# Patient Record
Sex: Female | Born: 1970 | Race: White | Hispanic: No | Marital: Single
Health system: Midwestern US, Academic
[De-identification: ages and names within clinical notes are randomized; demographics above are authoritative.]

---

## 2017-02-18 ENCOUNTER — Encounter: Admit: 2017-02-18 | Discharge: 2017-02-18 | Payer: BC Managed Care – PPO

## 2017-02-18 DIAGNOSIS — Z1231 Encounter for screening mammogram for malignant neoplasm of breast: Principal | ICD-10-CM

## 2017-04-26 ENCOUNTER — Encounter: Admit: 2017-04-26 | Discharge: 2017-04-26 | Payer: BC Managed Care – PPO

## 2017-04-27 ENCOUNTER — Encounter: Admit: 2017-04-27 | Discharge: 2017-04-27 | Payer: BC Managed Care – PPO

## 2017-04-27 DIAGNOSIS — M25562 Pain in left knee: Principal | ICD-10-CM

## 2017-04-28 ENCOUNTER — Encounter: Admit: 2017-04-28 | Discharge: 2017-04-28 | Payer: BC Managed Care – PPO

## 2017-04-28 ENCOUNTER — Ambulatory Visit: Admit: 2017-04-28 | Discharge: 2017-04-28 | Payer: BC Managed Care – PPO

## 2017-04-28 DIAGNOSIS — G8929 Other chronic pain: ICD-10-CM

## 2017-04-28 DIAGNOSIS — M2410 Other articular cartilage disorders, unspecified site: ICD-10-CM

## 2017-04-28 DIAGNOSIS — C801 Malignant (primary) neoplasm, unspecified: ICD-10-CM

## 2017-04-28 DIAGNOSIS — M25562 Pain in left knee: Principal | ICD-10-CM

## 2017-04-28 DIAGNOSIS — E039 Hypothyroidism, unspecified: ICD-10-CM

## 2017-04-28 DIAGNOSIS — I509 Heart failure, unspecified: ICD-10-CM

## 2017-04-28 DIAGNOSIS — D242 Benign neoplasm of left breast: ICD-10-CM

## 2017-04-28 DIAGNOSIS — G43909 Migraine, unspecified, not intractable, without status migrainosus: Principal | ICD-10-CM

## 2017-04-28 MED ORDER — DICLOFENAC SODIUM 50 MG PO TBEC
50 mg | ORAL_TABLET | Freq: Two times a day (BID) | ORAL | 0 refills | 60.00000 days | Status: AC
Start: 2017-04-28 — End: 2017-05-24

## 2017-05-04 ENCOUNTER — Ambulatory Visit: Admit: 2017-05-04 | Discharge: 2017-05-04 | Payer: BC Managed Care – PPO

## 2017-05-04 DIAGNOSIS — M2410 Other articular cartilage disorders, unspecified site: Principal | ICD-10-CM

## 2017-05-05 ENCOUNTER — Ambulatory Visit: Admit: 2017-05-05 | Discharge: 2017-05-06 | Payer: BC Managed Care – PPO

## 2017-05-05 ENCOUNTER — Encounter: Admit: 2017-05-05 | Discharge: 2017-05-05 | Payer: BC Managed Care – PPO

## 2017-05-05 DIAGNOSIS — D242 Benign neoplasm of left breast: ICD-10-CM

## 2017-05-05 DIAGNOSIS — C801 Malignant (primary) neoplasm, unspecified: ICD-10-CM

## 2017-05-05 DIAGNOSIS — I509 Heart failure, unspecified: ICD-10-CM

## 2017-05-05 DIAGNOSIS — E039 Hypothyroidism, unspecified: ICD-10-CM

## 2017-05-05 DIAGNOSIS — G43909 Migraine, unspecified, not intractable, without status migrainosus: Principal | ICD-10-CM

## 2017-05-05 DIAGNOSIS — M25562 Pain in left knee: Principal | ICD-10-CM

## 2017-05-06 ENCOUNTER — Encounter: Admit: 2017-05-06 | Discharge: 2017-05-06 | Payer: BC Managed Care – PPO

## 2017-05-06 DIAGNOSIS — M2410 Other articular cartilage disorders, unspecified site: Secondary | ICD-10-CM

## 2017-05-06 DIAGNOSIS — M238X2 Other internal derangements of left knee: ICD-10-CM

## 2017-05-06 DIAGNOSIS — G8929 Other chronic pain: Secondary | ICD-10-CM

## 2017-05-06 DIAGNOSIS — M949 Disorder of cartilage, unspecified: ICD-10-CM

## 2017-05-06 MED ORDER — CEFAZOLIN INJ 1GM IVP
2 g | Freq: Once | INTRAVENOUS | 0 refills | Status: CN
Start: 2017-05-06 — End: ?

## 2017-05-12 ENCOUNTER — Encounter: Admit: 2017-05-12 | Discharge: 2017-05-12 | Payer: BC Managed Care – PPO

## 2017-05-20 ENCOUNTER — Encounter: Admit: 2017-05-20 | Discharge: 2017-05-20 | Payer: BC Managed Care – PPO

## 2017-05-20 DIAGNOSIS — I509 Heart failure, unspecified: ICD-10-CM

## 2017-05-20 DIAGNOSIS — D242 Benign neoplasm of left breast: ICD-10-CM

## 2017-05-20 DIAGNOSIS — E039 Hypothyroidism, unspecified: ICD-10-CM

## 2017-05-20 DIAGNOSIS — G43909 Migraine, unspecified, not intractable, without status migrainosus: Principal | ICD-10-CM

## 2017-05-20 DIAGNOSIS — C801 Malignant (primary) neoplasm, unspecified: ICD-10-CM

## 2017-05-24 ENCOUNTER — Encounter: Admit: 2017-05-24 | Discharge: 2017-05-24 | Payer: BC Managed Care – PPO

## 2017-05-24 DIAGNOSIS — C801 Malignant (primary) neoplasm, unspecified: ICD-10-CM

## 2017-05-24 DIAGNOSIS — I509 Heart failure, unspecified: ICD-10-CM

## 2017-05-24 DIAGNOSIS — D242 Benign neoplasm of left breast: ICD-10-CM

## 2017-05-24 DIAGNOSIS — E039 Hypothyroidism, unspecified: ICD-10-CM

## 2017-05-24 DIAGNOSIS — G43909 Migraine, unspecified, not intractable, without status migrainosus: Principal | ICD-10-CM

## 2017-06-01 ENCOUNTER — Ambulatory Visit: Admit: 2017-06-01 | Discharge: 2017-06-01 | Payer: BC Managed Care – PPO

## 2017-06-01 ENCOUNTER — Encounter: Admit: 2017-06-01 | Discharge: 2017-06-01 | Payer: BC Managed Care – PPO

## 2017-06-01 DIAGNOSIS — D242 Benign neoplasm of left breast: ICD-10-CM

## 2017-06-01 DIAGNOSIS — M2242 Chondromalacia patellae, left knee: ICD-10-CM

## 2017-06-01 DIAGNOSIS — M948X6 Other specified disorders of cartilage, lower leg: Principal | ICD-10-CM

## 2017-06-01 DIAGNOSIS — E039 Hypothyroidism, unspecified: ICD-10-CM

## 2017-06-01 DIAGNOSIS — G43909 Migraine, unspecified, not intractable, without status migrainosus: Principal | ICD-10-CM

## 2017-06-01 DIAGNOSIS — C801 Malignant (primary) neoplasm, unspecified: ICD-10-CM

## 2017-06-01 DIAGNOSIS — I509 Heart failure, unspecified: ICD-10-CM

## 2017-06-01 MED ORDER — LIDOCAINE (PF) 200 MG/10 ML (2 %) IJ SYRG
0 refills | Status: DC
Start: 2017-06-01 — End: 2017-06-01
  Administered 2017-06-01: 14:00:00 100 mg via INTRAVENOUS

## 2017-06-01 MED ORDER — ROPIVACAINE (PF) 5 MG/ML (0.5 %) IJ SOLN
0 refills | Status: DC
Start: 2017-06-01 — End: 2017-06-01
  Administered 2017-06-01: 15:00:00 30 mL via INTRAMUSCULAR

## 2017-06-01 MED ORDER — LIDOCAINE (PF) 10 MG/ML (1 %) IJ SOLN
.1-2 mL | INTRAMUSCULAR | 0 refills | Status: DC | PRN
Start: 2017-06-01 — End: 2017-06-01

## 2017-06-01 MED ORDER — PROPOFOL INJ 10 MG/ML IV VIAL
0 refills | Status: DC
Start: 2017-06-01 — End: 2017-06-01
  Administered 2017-06-01: 14:00:00 200 mg via INTRAVENOUS

## 2017-06-01 MED ORDER — FENTANYL CITRATE (PF) 50 MCG/ML IJ SOLN
25 ug | INTRAVENOUS | 0 refills | Status: DC | PRN
Start: 2017-06-01 — End: 2017-06-01
  Administered 2017-06-01 (×2): 25 ug via INTRAVENOUS

## 2017-06-01 MED ORDER — FENTANYL CITRATE (PF) 50 MCG/ML IJ SOLN
0 refills | Status: DC
Start: 2017-06-01 — End: 2017-06-01
  Administered 2017-06-01 (×2): 50 ug via INTRAVENOUS

## 2017-06-01 MED ORDER — HYDROCODONE-ACETAMINOPHEN 5-325 MG PO TAB
1 | ORAL_TABLET | ORAL | 0 refills | 30.00000 days | Status: AC | PRN
Start: 2017-06-01 — End: 2017-09-13
  Filled 2017-06-01 (×2): qty 30, 8d supply, fill #1

## 2017-06-01 MED ORDER — ONDANSETRON HCL (PF) 4 MG/2 ML IJ SOLN
INTRAVENOUS | 0 refills | Status: DC
Start: 2017-06-01 — End: 2017-06-01
  Administered 2017-06-01: 15:00:00 4 mg via INTRAVENOUS

## 2017-06-01 MED ORDER — FENTANYL CITRATE (PF) 50 MCG/ML IJ SOLN
50 ug | INTRAVENOUS | 0 refills | Status: DC | PRN
Start: 2017-06-01 — End: 2017-06-01

## 2017-06-01 MED ORDER — OXYCODONE 5 MG PO TAB
5-10 mg | Freq: Once | ORAL | 0 refills | Status: CP | PRN
Start: 2017-06-01 — End: ?
  Administered 2017-06-01: 15:00:00 5 mg via ORAL

## 2017-06-01 MED ORDER — CEFAZOLIN INJ 1GM IVP
2 g | Freq: Once | INTRAVENOUS | 0 refills | Status: CP
Start: 2017-06-01 — End: ?
  Administered 2017-06-01: 14:00:00 2 g via INTRAVENOUS

## 2017-06-01 MED ORDER — ONDANSETRON HCL (PF) 4 MG/2 ML IJ SOLN
4 mg | Freq: Once | INTRAVENOUS | 0 refills | Status: DC | PRN
Start: 2017-06-01 — End: 2017-06-01

## 2017-06-01 MED ORDER — DIPHENHYDRAMINE HCL 50 MG/ML IJ SOLN
25 mg | Freq: Once | INTRAVENOUS | 0 refills | Status: DC | PRN
Start: 2017-06-01 — End: 2017-06-01

## 2017-06-01 MED ORDER — LACTATED RINGERS IV SOLP
1000 mL | INTRAVENOUS | 0 refills | Status: DC
Start: 2017-06-01 — End: 2017-06-01
  Administered 2017-06-01: 14:00:00 1000 mL via INTRAVENOUS

## 2017-06-01 MED ORDER — FENTANYL CITRATE (PF) 50 MCG/ML IJ SOLN
50 ug | INTRAVENOUS | 0 refills | Status: DC | PRN
Start: 2017-06-01 — End: 2017-06-01
  Administered 2017-06-01: 15:00:00 50 ug via INTRAVENOUS

## 2017-06-01 MED ORDER — HALOPERIDOL LACTATE 5 MG/ML IJ SOLN
1 mg | Freq: Once | INTRAVENOUS | 0 refills | Status: CP | PRN
Start: 2017-06-01 — End: ?
  Administered 2017-06-01: 15:00:00 1 mg via INTRAVENOUS

## 2017-06-01 MED ORDER — MEPERIDINE (PF) 25 MG/ML IJ SYRG
12.5 mg | INTRAVENOUS | 0 refills | Status: DC | PRN
Start: 2017-06-01 — End: 2017-06-01

## 2017-06-01 MED ORDER — MIDAZOLAM 1 MG/ML IJ SOLN
INTRAVENOUS | 0 refills | Status: DC
Start: 2017-06-01 — End: 2017-06-01
  Administered 2017-06-01: 14:00:00 2 mg via INTRAVENOUS

## 2017-06-03 ENCOUNTER — Encounter: Admit: 2017-06-03 | Discharge: 2017-06-03 | Payer: BC Managed Care – PPO

## 2017-06-03 DIAGNOSIS — I509 Heart failure, unspecified: ICD-10-CM

## 2017-06-03 DIAGNOSIS — D242 Benign neoplasm of left breast: ICD-10-CM

## 2017-06-03 DIAGNOSIS — C801 Malignant (primary) neoplasm, unspecified: ICD-10-CM

## 2017-06-03 DIAGNOSIS — E039 Hypothyroidism, unspecified: ICD-10-CM

## 2017-06-03 DIAGNOSIS — G43909 Migraine, unspecified, not intractable, without status migrainosus: Principal | ICD-10-CM

## 2017-06-16 ENCOUNTER — Ambulatory Visit: Admit: 2017-06-16 | Discharge: 2017-06-17 | Payer: BC Managed Care – PPO

## 2017-06-16 ENCOUNTER — Encounter: Admit: 2017-06-16 | Discharge: 2017-06-16 | Payer: BC Managed Care – PPO

## 2017-06-16 DIAGNOSIS — C801 Malignant (primary) neoplasm, unspecified: ICD-10-CM

## 2017-06-16 DIAGNOSIS — I509 Heart failure, unspecified: ICD-10-CM

## 2017-06-16 DIAGNOSIS — G43909 Migraine, unspecified, not intractable, without status migrainosus: Principal | ICD-10-CM

## 2017-06-16 DIAGNOSIS — E039 Hypothyroidism, unspecified: ICD-10-CM

## 2017-06-16 DIAGNOSIS — D242 Benign neoplasm of left breast: ICD-10-CM

## 2017-06-16 MED ORDER — TRAMADOL 50 MG PO TAB
50 mg | ORAL_TABLET | ORAL | 0 refills | Status: AC | PRN
Start: 2017-06-16 — End: 2017-09-13

## 2017-06-17 DIAGNOSIS — Z9889 Other specified postprocedural states: ICD-10-CM

## 2017-06-17 DIAGNOSIS — M949 Disorder of cartilage, unspecified: Principal | ICD-10-CM

## 2017-06-29 ENCOUNTER — Encounter: Admit: 2017-06-29 | Discharge: 2017-06-29 | Payer: BC Managed Care – PPO

## 2017-07-01 ENCOUNTER — Encounter: Admit: 2017-07-01 | Discharge: 2017-07-01 | Payer: BC Managed Care – PPO

## 2017-07-14 ENCOUNTER — Ambulatory Visit: Admit: 2017-07-14 | Discharge: 2017-07-15 | Payer: BC Managed Care – PPO

## 2017-07-14 ENCOUNTER — Encounter: Admit: 2017-07-14 | Discharge: 2017-07-14 | Payer: BC Managed Care – PPO

## 2017-07-14 DIAGNOSIS — D242 Benign neoplasm of left breast: ICD-10-CM

## 2017-07-14 DIAGNOSIS — M949 Disorder of cartilage, unspecified: Principal | ICD-10-CM

## 2017-07-14 DIAGNOSIS — Z9889 Other specified postprocedural states: ICD-10-CM

## 2017-07-14 DIAGNOSIS — C801 Malignant (primary) neoplasm, unspecified: ICD-10-CM

## 2017-07-14 DIAGNOSIS — G43909 Migraine, unspecified, not intractable, without status migrainosus: Principal | ICD-10-CM

## 2017-07-14 DIAGNOSIS — I509 Heart failure, unspecified: ICD-10-CM

## 2017-07-14 DIAGNOSIS — E039 Hypothyroidism, unspecified: ICD-10-CM

## 2017-07-20 ENCOUNTER — Encounter: Admit: 2017-07-20 | Discharge: 2017-07-20 | Payer: BC Managed Care – PPO

## 2017-07-22 ENCOUNTER — Encounter: Admit: 2017-07-22 | Discharge: 2017-07-22 | Payer: BC Managed Care – PPO

## 2017-07-22 DIAGNOSIS — M949 Disorder of cartilage, unspecified: Principal | ICD-10-CM

## 2017-07-22 MED ORDER — CEFAZOLIN INJ 1GM IVP
2 g | Freq: Once | INTRAVENOUS | 0 refills | Status: CN
Start: 2017-07-22 — End: ?

## 2017-07-23 ENCOUNTER — Encounter: Admit: 2017-07-23 | Discharge: 2017-07-23 | Payer: BC Managed Care – PPO

## 2017-07-28 ENCOUNTER — Ambulatory Visit: Admit: 2017-07-28 | Discharge: 2017-07-29 | Payer: BC Managed Care – PPO

## 2017-07-28 ENCOUNTER — Encounter: Admit: 2017-07-28 | Discharge: 2017-07-28 | Payer: BC Managed Care – PPO

## 2017-07-28 DIAGNOSIS — Z9889 Other specified postprocedural states: ICD-10-CM

## 2017-07-28 DIAGNOSIS — D242 Benign neoplasm of left breast: ICD-10-CM

## 2017-07-28 DIAGNOSIS — I509 Heart failure, unspecified: ICD-10-CM

## 2017-07-28 DIAGNOSIS — M949 Disorder of cartilage, unspecified: Principal | ICD-10-CM

## 2017-07-28 DIAGNOSIS — C801 Malignant (primary) neoplasm, unspecified: ICD-10-CM

## 2017-07-28 DIAGNOSIS — G43909 Migraine, unspecified, not intractable, without status migrainosus: Principal | ICD-10-CM

## 2017-07-28 DIAGNOSIS — E039 Hypothyroidism, unspecified: ICD-10-CM

## 2017-08-11 ENCOUNTER — Encounter: Admit: 2017-08-11 | Discharge: 2017-08-11 | Payer: BC Managed Care – PPO

## 2017-09-03 ENCOUNTER — Ambulatory Visit: Admit: 2017-09-03 | Discharge: 2017-09-03 | Payer: BC Managed Care – PPO

## 2017-09-03 DIAGNOSIS — Z1231 Encounter for screening mammogram for malignant neoplasm of breast: Principal | ICD-10-CM

## 2017-09-13 ENCOUNTER — Encounter: Admit: 2017-09-13 | Discharge: 2017-09-13 | Payer: BC Managed Care – PPO

## 2017-09-13 DIAGNOSIS — C801 Malignant (primary) neoplasm, unspecified: ICD-10-CM

## 2017-09-13 DIAGNOSIS — I509 Heart failure, unspecified: ICD-10-CM

## 2017-09-13 DIAGNOSIS — D242 Benign neoplasm of left breast: ICD-10-CM

## 2017-09-13 DIAGNOSIS — E039 Hypothyroidism, unspecified: ICD-10-CM

## 2017-09-13 DIAGNOSIS — G43909 Migraine, unspecified, not intractable, without status migrainosus: Principal | ICD-10-CM

## 2017-09-15 ENCOUNTER — Encounter: Admit: 2017-09-15 | Discharge: 2017-09-15 | Payer: BC Managed Care – PPO

## 2017-09-15 ENCOUNTER — Ambulatory Visit: Admit: 2017-09-15 | Discharge: 2017-09-16 | Payer: BC Managed Care – PPO

## 2017-09-15 DIAGNOSIS — D242 Benign neoplasm of left breast: ICD-10-CM

## 2017-09-15 DIAGNOSIS — M949 Disorder of cartilage, unspecified: Principal | ICD-10-CM

## 2017-09-15 DIAGNOSIS — I509 Heart failure, unspecified: ICD-10-CM

## 2017-09-15 DIAGNOSIS — G43909 Migraine, unspecified, not intractable, without status migrainosus: Principal | ICD-10-CM

## 2017-09-15 DIAGNOSIS — E039 Hypothyroidism, unspecified: ICD-10-CM

## 2017-09-15 DIAGNOSIS — C801 Malignant (primary) neoplasm, unspecified: ICD-10-CM

## 2017-09-16 ENCOUNTER — Encounter: Admit: 2017-09-16 | Discharge: 2017-09-16 | Payer: BC Managed Care – PPO

## 2017-09-16 DIAGNOSIS — Z8679 Personal history of other diseases of the circulatory system: ICD-10-CM

## 2017-09-16 DIAGNOSIS — Z01818 Encounter for other preprocedural examination: ICD-10-CM

## 2017-09-16 DIAGNOSIS — R079 Chest pain, unspecified: ICD-10-CM

## 2017-09-16 DIAGNOSIS — M949 Disorder of cartilage, unspecified: Principal | ICD-10-CM

## 2017-09-23 ENCOUNTER — Ambulatory Visit: Admit: 2017-09-23 | Discharge: 2017-09-24 | Payer: BC Managed Care – PPO

## 2017-09-23 ENCOUNTER — Encounter: Admit: 2017-09-23 | Discharge: 2017-09-23 | Payer: BC Managed Care – PPO

## 2017-09-23 DIAGNOSIS — C801 Malignant (primary) neoplasm, unspecified: ICD-10-CM

## 2017-09-23 DIAGNOSIS — D242 Benign neoplasm of left breast: ICD-10-CM

## 2017-09-23 DIAGNOSIS — I509 Heart failure, unspecified: ICD-10-CM

## 2017-09-23 DIAGNOSIS — R0789 Other chest pain: ICD-10-CM

## 2017-09-23 DIAGNOSIS — O903 Peripartum cardiomyopathy: ICD-10-CM

## 2017-09-23 DIAGNOSIS — R0600 Dyspnea, unspecified: ICD-10-CM

## 2017-09-23 DIAGNOSIS — G43909 Migraine, unspecified, not intractable, without status migrainosus: Principal | ICD-10-CM

## 2017-09-23 DIAGNOSIS — E039 Hypothyroidism, unspecified: ICD-10-CM

## 2017-09-24 ENCOUNTER — Encounter: Admit: 2017-09-24 | Discharge: 2017-09-24 | Payer: BC Managed Care – PPO

## 2017-09-24 DIAGNOSIS — R079 Chest pain, unspecified: ICD-10-CM

## 2017-09-24 DIAGNOSIS — Z8679 Personal history of other diseases of the circulatory system: ICD-10-CM

## 2017-09-24 DIAGNOSIS — Z01818 Encounter for other preprocedural examination: ICD-10-CM

## 2017-09-24 DIAGNOSIS — M949 Disorder of cartilage, unspecified: Principal | ICD-10-CM

## 2017-09-27 ENCOUNTER — Encounter: Admit: 2017-09-27 | Discharge: 2017-09-27 | Payer: BC Managed Care – PPO

## 2017-09-28 ENCOUNTER — Encounter: Admit: 2017-09-28 | Discharge: 2017-09-28 | Payer: BC Managed Care – PPO

## 2017-09-28 DIAGNOSIS — O903 Peripartum cardiomyopathy: ICD-10-CM

## 2017-09-28 DIAGNOSIS — G43909 Migraine, unspecified, not intractable, without status migrainosus: Principal | ICD-10-CM

## 2017-09-28 DIAGNOSIS — D242 Benign neoplasm of left breast: ICD-10-CM

## 2017-09-28 DIAGNOSIS — I509 Heart failure, unspecified: ICD-10-CM

## 2017-09-28 DIAGNOSIS — C801 Malignant (primary) neoplasm, unspecified: ICD-10-CM

## 2017-09-28 DIAGNOSIS — E039 Hypothyroidism, unspecified: ICD-10-CM

## 2017-09-28 MED ORDER — FENTANYL CITRATE (PF) 50 MCG/ML IJ SOLN
25 ug | INTRAVENOUS | 0 refills | Status: DC | PRN
Start: 2017-09-28 — End: 2017-09-29
  Administered 2017-09-29 (×2): 25 ug via INTRAVENOUS

## 2017-09-28 MED ORDER — DIPHENHYDRAMINE HCL 25 MG PO CAP
25 mg | ORAL | 0 refills | Status: DC | PRN
Start: 2017-09-28 — End: 2017-09-29

## 2017-09-28 MED ORDER — LIDOCAINE (PF) 10 MG/ML (1 %) IJ SOLN
.1-2 mL | INTRAMUSCULAR | 0 refills | Status: DC | PRN
Start: 2017-09-28 — End: 2017-09-28
  Administered 2017-09-28: 14:00:00 0.1 mL via INTRAMUSCULAR

## 2017-09-28 MED ORDER — DEXAMETHASONE SODIUM PHOSPHATE 4 MG/ML IJ SOLN
INTRAVENOUS | 0 refills | Status: DC
Start: 2017-09-28 — End: 2017-09-28
  Administered 2017-09-28: 14:00:00 8 mg via INTRAVENOUS

## 2017-09-28 MED ORDER — ACETAMINOPHEN 325 MG PO TAB
650 mg | ORAL | 0 refills | Status: DC
Start: 2017-09-28 — End: 2017-09-29
  Administered 2017-09-29: 08:00:00 650 mg via ORAL

## 2017-09-28 MED ORDER — OXYCODONE 5 MG PO TAB
5-10 mg | ORAL | 0 refills | Status: DC | PRN
Start: 2017-09-28 — End: 2017-09-29
  Administered 2017-09-28 – 2017-09-29 (×3): 10 mg via ORAL

## 2017-09-28 MED ORDER — MIDAZOLAM 1 MG/ML IJ SOLN
INTRAVENOUS | 0 refills | Status: DC
Start: 2017-09-28 — End: 2017-09-28
  Administered 2017-09-28: 14:00:00 2 mg via INTRAVENOUS

## 2017-09-28 MED ORDER — EPHEDRINE SULFATE 50 MG/ML IJ SOLN
0 refills | Status: DC
Start: 2017-09-28 — End: 2017-09-28
  Administered 2017-09-28 (×5): 10 mg via INTRAVENOUS

## 2017-09-28 MED ORDER — FENTANYL CITRATE (PF) 50 MCG/ML IJ SOLN
25 ug | INTRAVENOUS | 0 refills | Status: DC | PRN
Start: 2017-09-28 — End: 2017-09-28

## 2017-09-28 MED ORDER — LEVOTHYROXINE 137 MCG PO TAB
137 ug | Freq: Every day | ORAL | 0 refills | Status: CN
Start: 2017-09-28 — End: ?

## 2017-09-28 MED ORDER — DIPHENHYDRAMINE HCL 50 MG/ML IJ SOLN
25 mg | INTRAVENOUS | 0 refills | Status: DC | PRN
Start: 2017-09-28 — End: 2017-09-29

## 2017-09-28 MED ORDER — CEFAZOLIN INJ 1GM IVP
2 g | Freq: Once | INTRAVENOUS | 0 refills | Status: CP
Start: 2017-09-28 — End: ?
  Administered 2017-09-28: 14:00:00 2 g via INTRAVENOUS

## 2017-09-28 MED ORDER — LACTATED RINGERS IV SOLP
INTRAVENOUS | 0 refills | Status: DC
Start: 2017-09-28 — End: 2017-09-28
  Administered 2017-09-28: 17:00:00 1000.000 mL via INTRAVENOUS

## 2017-09-28 MED ORDER — LACTATED RINGERS IV SOLP
INTRAVENOUS | 0 refills | Status: DC
Start: 2017-09-28 — End: 2017-09-28
  Administered 2017-09-28: 14:00:00 1000.000 mL via INTRAVENOUS

## 2017-09-28 MED ORDER — ONDANSETRON HCL (PF) 4 MG/2 ML IJ SOLN
INTRAVENOUS | 0 refills | Status: DC
Start: 2017-09-28 — End: 2017-09-28
  Administered 2017-09-28: 16:00:00 4 mg via INTRAVENOUS

## 2017-09-28 MED ORDER — TRANEXAMIC ACID IN NS NON-STANDARD IVPB ICC
1 g | INTRAVENOUS | 0 refills | Status: AC
Start: 2017-09-28 — End: ?

## 2017-09-28 MED ORDER — ACETAMINOPHEN 650 MG RE SUPP
650 mg | RECTAL | 0 refills | Status: DC | PRN
Start: 2017-09-28 — End: 2017-09-29

## 2017-09-28 MED ORDER — ONDANSETRON HCL 4 MG PO TAB
4 mg | ORAL_TABLET | ORAL | 0 refills | 8.00000 days | Status: AC | PRN
Start: 2017-09-28 — End: 2018-05-23
  Filled 2017-09-29: qty 20, 7d supply

## 2017-09-28 MED ORDER — LEVOTHYROXINE 137 MCG PO TAB
137 ug | Freq: Every day | ORAL | 0 refills | Status: DC
Start: 2017-09-28 — End: 2017-09-29
  Administered 2017-09-29: 12:00:00 137 ug via ORAL

## 2017-09-28 MED ORDER — BISACODYL 10 MG RE SUPP
10 mg | Freq: Every day | RECTAL | 0 refills | Status: DC | PRN
Start: 2017-09-28 — End: 2017-09-29

## 2017-09-28 MED ORDER — FENTANYL CITRATE (PF) 50 MCG/ML IJ SOLN
50 ug | INTRAVENOUS | 0 refills | Status: DC | PRN
Start: 2017-09-28 — End: 2017-09-28
  Administered 2017-09-28 (×3): 50 ug via INTRAVENOUS

## 2017-09-28 MED ORDER — SODIUM CHLORIDE 0.45% WITH POTASSIUM CHLORIDE 20 MEQ/L IV SOLP
INTRAVENOUS | 0 refills | Status: DC
Start: 2017-09-28 — End: 2017-09-29
  Administered 2017-09-28: 21:00:00 1000.000 mL via INTRAVENOUS

## 2017-09-28 MED ORDER — OXYCODONE 5 MG PO TAB
5-10 mg | Freq: Once | ORAL | 0 refills | Status: DC | PRN
Start: 2017-09-28 — End: 2017-09-28

## 2017-09-28 MED ORDER — OXYCODONE-ACETAMINOPHEN 5-325 MG PO TAB
1-2 | ORAL_TABLET | ORAL | 0 refills | 2.00000 days | Status: AC | PRN
Start: 2017-09-28 — End: 2017-10-04

## 2017-09-28 MED ORDER — ACETAMINOPHEN 1,000 MG/100 ML (10 MG/ML) IV SOLN
1000 mg | Freq: Once | INTRAVENOUS | 0 refills | Status: CP
Start: 2017-09-28 — End: ?

## 2017-09-28 MED ORDER — TRANEXAMIC ACID 1 G IVPB
0 refills | Status: DC
Start: 2017-09-28 — End: 2017-09-28
  Administered 2017-09-28 (×2): 1 g via INTRAVENOUS

## 2017-09-28 MED ORDER — MAGNESIUM HYDROXIDE 2,400 MG/10 ML PO SUSP
10 mL | Freq: Every day | ORAL | 0 refills | Status: DC
Start: 2017-09-28 — End: 2017-09-29
  Administered 2017-09-29: 02:00:00 10 mL via ORAL

## 2017-09-28 MED ORDER — FENTANYL CITRATE (PF) 50 MCG/ML IJ SOLN
0 refills | Status: DC
Start: 2017-09-28 — End: 2017-09-28
  Administered 2017-09-28 (×5): 50 ug via INTRAVENOUS

## 2017-09-28 MED ORDER — CEFAZOLIN INJ 1GM IVP
2 g | INTRAVENOUS | 0 refills | Status: CP
Start: 2017-09-28 — End: ?
  Administered 2017-09-28 – 2017-09-29 (×2): 2 g via INTRAVENOUS

## 2017-09-28 MED ORDER — LIDOCAINE (PF) 200 MG/10 ML (2 %) IJ SYRG
0 refills | Status: DC
Start: 2017-09-28 — End: 2017-09-28
  Administered 2017-09-28: 14:00:00 100 mg via INTRAVENOUS

## 2017-09-28 MED ORDER — DOCUSATE SODIUM 100 MG PO CAP
100 mg | Freq: Two times a day (BID) | ORAL | 0 refills | Status: DC
Start: 2017-09-28 — End: 2017-09-29
  Administered 2017-09-29: 02:00:00 100 mg via ORAL

## 2017-09-28 MED ORDER — OXYCODONE-ACETAMINOPHEN 5-325 MG PO TAB
1-2 | ORAL | 0 refills | Status: DC | PRN
Start: 2017-09-28 — End: 2017-09-29
  Administered 2017-09-28: 20:00:00 2 via ORAL

## 2017-09-28 MED ORDER — DIAZEPAM 2 MG PO TAB
2 mg | ORAL_TABLET | ORAL | 0 refills | 7.00000 days | Status: AC | PRN
Start: 2017-09-28 — End: 2018-05-23
  Filled 2017-09-29 (×2): qty 10, 3d supply, fill #1

## 2017-09-28 MED ORDER — ACETAMINOPHEN 325 MG PO TAB
650 mg | ORAL | 0 refills | Status: DC | PRN
Start: 2017-09-28 — End: 2017-09-29

## 2017-09-28 MED ORDER — ONDANSETRON HCL (PF) 4 MG/2 ML IJ SOLN
4 mg | INTRAVENOUS | 0 refills | Status: DC | PRN
Start: 2017-09-28 — End: 2017-09-29
  Administered 2017-09-29: 17:00:00 4 mg via INTRAVENOUS

## 2017-09-28 MED ORDER — THROMBIN (BOVINE) 5,000 UNIT TP SOLR
0 refills | Status: DC
Start: 2017-09-28 — End: 2017-09-28
  Administered 2017-09-28: 15:00:00 10000 [IU] via TOPICAL

## 2017-09-28 MED ORDER — FENTANYL CITRATE (PF) 50 MCG/ML IJ SOLN
50 ug | INTRAVENOUS | 0 refills | Status: DC | PRN
Start: 2017-09-28 — End: 2017-09-28

## 2017-09-28 MED ORDER — DOXYCYCLINE HYCLATE 100 MG PO TAB
100 mg | Freq: Two times a day (BID) | ORAL | 0 refills | Status: DC
Start: 2017-09-28 — End: 2017-09-29
  Administered 2017-09-29: 02:00:00 100 mg via ORAL

## 2017-09-28 MED ORDER — PROPOFOL INJ 10 MG/ML IV VIAL
0 refills | Status: DC
Start: 2017-09-28 — End: 2017-09-28
  Administered 2017-09-28: 14:00:00 200 mg via INTRAVENOUS

## 2017-09-28 MED ADMIN — ACETAMINOPHEN 1,000 MG/100 ML (10 MG/ML) IV SOLN [305632]: 1000 mg | INTRAVENOUS | @ 17:00:00 | Stop: 2017-09-28 | NDC 43825010201

## 2017-09-28 MED ADMIN — SODIUM CHLORIDE 0.9 % IJ SOLN [7319]: 20 mL | INTRAVENOUS | @ 21:00:00 | Stop: 2017-09-28 | NDC 00409488803

## 2017-09-28 MED ADMIN — FENTANYL CITRATE (PF) 50 MCG/ML IJ SOLN [3037]: 100 ug | INTRAVENOUS | @ 16:00:00 | Stop: 2017-09-28 | NDC 00641602701

## 2017-09-29 ENCOUNTER — Encounter: Admit: 2017-09-28 | Discharge: 2017-09-29 | Payer: BC Managed Care – PPO

## 2017-09-29 ENCOUNTER — Encounter: Admit: 2017-09-29 | Discharge: 2017-09-29 | Payer: BC Managed Care – PPO

## 2017-09-29 DIAGNOSIS — M949 Disorder of cartilage, unspecified: Principal | ICD-10-CM

## 2017-09-29 DIAGNOSIS — F419 Anxiety disorder, unspecified: ICD-10-CM

## 2017-09-29 MED ORDER — OXYCODONE-ACETAMINOPHEN 10-325 MG PO TAB
1-2 | ORAL | 0 refills | Status: DC | PRN
Start: 2017-09-29 — End: 2017-09-29
  Administered 2017-09-29: 19:00:00 2 via ORAL

## 2017-09-29 MED ORDER — OXYCODONE-ACETAMINOPHEN 7.5-325 MG PO TAB
1-2 | ORAL_TABLET | ORAL | 0 refills | 2.00000 days | Status: AC | PRN
Start: 2017-09-29 — End: 2017-10-04
  Filled 2017-09-29 (×2): qty 30, 3d supply, fill #1

## 2017-09-29 MED ADMIN — SODIUM CHLORIDE 0.9 % IJ SOLN [7319]: 20 mL | INTRAVENOUS | @ 06:00:00 | Stop: 2017-09-29 | NDC 00409488803

## 2017-09-29 MED ADMIN — OXYCODONE-ACETAMINOPHEN 10-325 MG PO TAB [31864]: 2 | ORAL | @ 12:00:00 | Stop: 2017-09-29 | NDC 00904643961

## 2017-09-30 ENCOUNTER — Encounter: Admit: 2017-09-30 | Discharge: 2017-09-30 | Payer: BC Managed Care – PPO

## 2017-09-30 DIAGNOSIS — O903 Peripartum cardiomyopathy: ICD-10-CM

## 2017-09-30 DIAGNOSIS — G43909 Migraine, unspecified, not intractable, without status migrainosus: Principal | ICD-10-CM

## 2017-09-30 DIAGNOSIS — C801 Malignant (primary) neoplasm, unspecified: ICD-10-CM

## 2017-09-30 DIAGNOSIS — D242 Benign neoplasm of left breast: ICD-10-CM

## 2017-09-30 DIAGNOSIS — E039 Hypothyroidism, unspecified: ICD-10-CM

## 2017-09-30 DIAGNOSIS — I509 Heart failure, unspecified: ICD-10-CM

## 2017-10-01 ENCOUNTER — Encounter: Admit: 2017-10-01 | Discharge: 2017-10-01 | Payer: BC Managed Care – PPO

## 2017-10-04 ENCOUNTER — Encounter: Admit: 2017-10-04 | Discharge: 2017-10-04 | Payer: BC Managed Care – PPO

## 2017-10-04 MED ORDER — OXYCODONE-ACETAMINOPHEN 5-325 MG PO TAB
1-2 | ORAL_TABLET | ORAL | 0 refills | 2.00000 days | Status: AC | PRN
Start: 2017-10-04 — End: 2018-05-23

## 2017-10-06 ENCOUNTER — Encounter: Admit: 2017-10-06 | Discharge: 2017-10-06 | Payer: BC Managed Care – PPO

## 2017-10-06 MED ORDER — HYDROCODONE-ACETAMINOPHEN 5-325 MG PO TAB
1-2 | ORAL_TABLET | ORAL | 0 refills | 30.00000 days | Status: AC | PRN
Start: 2017-10-06 — End: 2018-05-23

## 2017-10-13 ENCOUNTER — Encounter: Admit: 2017-10-13 | Discharge: 2017-10-13 | Payer: BC Managed Care – PPO

## 2017-10-13 ENCOUNTER — Ambulatory Visit: Admit: 2017-10-13 | Discharge: 2017-10-14 | Payer: BC Managed Care – PPO

## 2017-10-13 DIAGNOSIS — C801 Malignant (primary) neoplasm, unspecified: ICD-10-CM

## 2017-10-13 DIAGNOSIS — E039 Hypothyroidism, unspecified: ICD-10-CM

## 2017-10-13 DIAGNOSIS — I509 Heart failure, unspecified: ICD-10-CM

## 2017-10-13 DIAGNOSIS — G43909 Migraine, unspecified, not intractable, without status migrainosus: Principal | ICD-10-CM

## 2017-10-13 DIAGNOSIS — D242 Benign neoplasm of left breast: ICD-10-CM

## 2017-10-13 DIAGNOSIS — O903 Peripartum cardiomyopathy: ICD-10-CM

## 2017-10-14 DIAGNOSIS — Z9889 Other specified postprocedural states: ICD-10-CM

## 2017-10-14 DIAGNOSIS — M949 Disorder of cartilage, unspecified: Principal | ICD-10-CM

## 2017-10-19 ENCOUNTER — Encounter: Admit: 2017-10-19 | Discharge: 2017-10-19 | Payer: BC Managed Care – PPO

## 2017-10-20 ENCOUNTER — Encounter: Admit: 2017-10-20 | Discharge: 2017-10-20 | Payer: BC Managed Care – PPO

## 2017-10-26 ENCOUNTER — Encounter: Admit: 2017-10-26 | Discharge: 2017-10-26 | Payer: BC Managed Care – PPO

## 2017-11-10 ENCOUNTER — Ambulatory Visit: Admit: 2017-11-10 | Discharge: 2017-11-11 | Payer: BC Managed Care – PPO

## 2017-11-10 ENCOUNTER — Encounter: Admit: 2017-11-10 | Discharge: 2017-11-10 | Payer: BC Managed Care – PPO

## 2017-11-10 DIAGNOSIS — M949 Disorder of cartilage, unspecified: Principal | ICD-10-CM

## 2017-11-10 DIAGNOSIS — D242 Benign neoplasm of left breast: ICD-10-CM

## 2017-11-10 DIAGNOSIS — C801 Malignant (primary) neoplasm, unspecified: ICD-10-CM

## 2017-11-10 DIAGNOSIS — O903 Peripartum cardiomyopathy: ICD-10-CM

## 2017-11-10 DIAGNOSIS — G43909 Migraine, unspecified, not intractable, without status migrainosus: Principal | ICD-10-CM

## 2017-11-10 DIAGNOSIS — E039 Hypothyroidism, unspecified: ICD-10-CM

## 2017-11-10 DIAGNOSIS — I509 Heart failure, unspecified: ICD-10-CM

## 2017-11-11 ENCOUNTER — Encounter: Admit: 2017-11-11 | Discharge: 2017-11-11 | Payer: BC Managed Care – PPO

## 2017-11-17 ENCOUNTER — Encounter: Admit: 2017-11-17 | Discharge: 2017-11-17 | Payer: BC Managed Care – PPO

## 2017-11-22 ENCOUNTER — Encounter: Admit: 2017-11-22 | Discharge: 2017-11-22 | Payer: BC Managed Care – PPO

## 2017-11-30 ENCOUNTER — Encounter: Admit: 2017-11-30 | Discharge: 2017-11-30 | Payer: BC Managed Care – PPO

## 2017-11-30 ENCOUNTER — Ambulatory Visit: Admit: 2017-11-30 | Discharge: 2017-11-30 | Payer: BC Managed Care – PPO

## 2017-11-30 DIAGNOSIS — O903 Peripartum cardiomyopathy: ICD-10-CM

## 2017-11-30 DIAGNOSIS — Z01818 Encounter for other preprocedural examination: Principal | ICD-10-CM

## 2017-11-30 DIAGNOSIS — C801 Malignant (primary) neoplasm, unspecified: ICD-10-CM

## 2017-11-30 DIAGNOSIS — R0609 Other forms of dyspnea: ICD-10-CM

## 2017-11-30 DIAGNOSIS — I509 Heart failure, unspecified: ICD-10-CM

## 2017-11-30 DIAGNOSIS — E039 Hypothyroidism, unspecified: ICD-10-CM

## 2017-11-30 DIAGNOSIS — G43909 Migraine, unspecified, not intractable, without status migrainosus: Principal | ICD-10-CM

## 2017-11-30 DIAGNOSIS — D242 Benign neoplasm of left breast: ICD-10-CM

## 2017-11-30 MED ORDER — FUROSEMIDE 20 MG PO TAB
20 mg | ORAL_TABLET | Freq: Every day | ORAL | 3 refills | 90.00000 days | Status: AC | PRN
Start: 2017-11-30 — End: 2018-05-23

## 2017-11-30 MED ORDER — PERFLUTREN LIPID MICROSPHERES 1.1 MG/ML IV SUSP
1-20 mL | Freq: Once | INTRAVENOUS | 0 refills | Status: CP | PRN
Start: 2017-11-30 — End: ?

## 2017-11-30 MED ADMIN — PERFLUTREN LIPID MICROSPHERES 1.1 MG/ML IV SUSP [79178]: 3 mL | INTRAVENOUS | @ 19:00:00 | Stop: 2017-11-30 | NDC 11994001104

## 2017-12-03 ENCOUNTER — Encounter: Admit: 2017-12-03 | Discharge: 2017-12-03 | Payer: BC Managed Care – PPO

## 2017-12-22 ENCOUNTER — Ambulatory Visit: Admit: 2017-12-22 | Discharge: 2017-12-23 | Payer: BC Managed Care – PPO

## 2017-12-22 ENCOUNTER — Encounter: Admit: 2017-12-22 | Discharge: 2017-12-22 | Payer: BC Managed Care – PPO

## 2017-12-22 DIAGNOSIS — D242 Benign neoplasm of left breast: ICD-10-CM

## 2017-12-22 DIAGNOSIS — I509 Heart failure, unspecified: ICD-10-CM

## 2017-12-22 DIAGNOSIS — E039 Hypothyroidism, unspecified: ICD-10-CM

## 2017-12-22 DIAGNOSIS — G43909 Migraine, unspecified, not intractable, without status migrainosus: Principal | ICD-10-CM

## 2017-12-22 DIAGNOSIS — C801 Malignant (primary) neoplasm, unspecified: ICD-10-CM

## 2017-12-22 DIAGNOSIS — M949 Disorder of cartilage, unspecified: Principal | ICD-10-CM

## 2017-12-22 DIAGNOSIS — O903 Peripartum cardiomyopathy: ICD-10-CM

## 2017-12-22 DIAGNOSIS — Z4889 Encounter for other specified surgical aftercare: ICD-10-CM

## 2017-12-27 ENCOUNTER — Encounter: Admit: 2017-12-27 | Discharge: 2017-12-27 | Payer: BC Managed Care – PPO

## 2018-01-27 ENCOUNTER — Encounter: Admit: 2018-01-27 | Discharge: 2018-01-27 | Payer: BC Managed Care – PPO

## 2018-01-27 DIAGNOSIS — Z9889 Other specified postprocedural states: Principal | ICD-10-CM

## 2018-02-02 ENCOUNTER — Ambulatory Visit: Admit: 2018-02-02 | Discharge: 2018-02-03 | Payer: BC Managed Care – PPO

## 2018-02-02 ENCOUNTER — Encounter: Admit: 2018-02-02 | Discharge: 2018-02-02 | Payer: BC Managed Care – PPO

## 2018-02-02 DIAGNOSIS — I509 Heart failure, unspecified: ICD-10-CM

## 2018-02-02 DIAGNOSIS — C801 Malignant (primary) neoplasm, unspecified: ICD-10-CM

## 2018-02-02 DIAGNOSIS — O903 Peripartum cardiomyopathy: ICD-10-CM

## 2018-02-02 DIAGNOSIS — G43909 Migraine, unspecified, not intractable, without status migrainosus: Principal | ICD-10-CM

## 2018-02-02 DIAGNOSIS — E039 Hypothyroidism, unspecified: ICD-10-CM

## 2018-02-02 DIAGNOSIS — D242 Benign neoplasm of left breast: ICD-10-CM

## 2018-02-03 DIAGNOSIS — M949 Disorder of cartilage, unspecified: Principal | ICD-10-CM

## 2018-02-03 DIAGNOSIS — Z9889 Other specified postprocedural states: ICD-10-CM

## 2018-02-04 ENCOUNTER — Encounter: Admit: 2018-02-04 | Discharge: 2018-02-04 | Payer: BC Managed Care – PPO

## 2018-03-01 ENCOUNTER — Encounter: Admit: 2018-03-01 | Discharge: 2018-03-01 | Payer: BC Managed Care – PPO

## 2018-03-08 ENCOUNTER — Encounter: Admit: 2018-03-08 | Discharge: 2018-03-08 | Payer: BC Managed Care – PPO

## 2018-03-08 ENCOUNTER — Ambulatory Visit: Admit: 2018-03-08 | Discharge: 2018-03-08 | Payer: BC Managed Care – PPO

## 2018-03-08 ENCOUNTER — Ambulatory Visit: Admit: 2018-03-08 | Discharge: 2018-03-09 | Payer: BC Managed Care – PPO

## 2018-03-08 DIAGNOSIS — O903 Peripartum cardiomyopathy: Secondary | ICD-10-CM

## 2018-03-08 DIAGNOSIS — E039 Hypothyroidism, unspecified: Secondary | ICD-10-CM

## 2018-03-08 DIAGNOSIS — G43909 Migraine, unspecified, not intractable, without status migrainosus: Secondary | ICD-10-CM

## 2018-03-08 DIAGNOSIS — C801 Malignant (primary) neoplasm, unspecified: Secondary | ICD-10-CM

## 2018-03-08 DIAGNOSIS — I509 Heart failure, unspecified: Secondary | ICD-10-CM

## 2018-03-08 DIAGNOSIS — D242 Benign neoplasm of left breast: Secondary | ICD-10-CM

## 2018-03-09 DIAGNOSIS — R0789 Other chest pain: Secondary | ICD-10-CM

## 2018-03-09 DIAGNOSIS — O903 Peripartum cardiomyopathy: Secondary | ICD-10-CM

## 2018-03-09 DIAGNOSIS — R0609 Other forms of dyspnea: Secondary | ICD-10-CM

## 2018-03-21 ENCOUNTER — Encounter: Admit: 2018-03-21 | Discharge: 2018-03-21 | Payer: BC Managed Care – PPO

## 2018-04-06 ENCOUNTER — Ambulatory Visit: Admit: 2018-04-06 | Discharge: 2018-04-07 | Payer: BC Managed Care – PPO

## 2018-04-06 ENCOUNTER — Encounter: Admit: 2018-04-06 | Discharge: 2018-04-06 | Payer: BC Managed Care – PPO

## 2018-04-06 DIAGNOSIS — E039 Hypothyroidism, unspecified: ICD-10-CM

## 2018-04-06 DIAGNOSIS — C801 Malignant (primary) neoplasm, unspecified: ICD-10-CM

## 2018-04-06 DIAGNOSIS — G43909 Migraine, unspecified, not intractable, without status migrainosus: Principal | ICD-10-CM

## 2018-04-06 DIAGNOSIS — I509 Heart failure, unspecified: ICD-10-CM

## 2018-04-06 DIAGNOSIS — O903 Peripartum cardiomyopathy: ICD-10-CM

## 2018-04-06 DIAGNOSIS — D242 Benign neoplasm of left breast: ICD-10-CM

## 2018-04-06 NOTE — Patient Instructions
J. Paul Schroeppel, MD  Corey Whitesides, PA-C  [facility] - Phone [phone] - Fax [phone]   [address], [address] - [place], [place] [address]  Arnol Mcgibbon, RN - Clinical Nurse Coordinator  Drew Hutchison, ATC - Clinical Athletic Trainer

## 2018-04-07 DIAGNOSIS — Z9889 Other specified postprocedural states: Principal | ICD-10-CM

## 2018-04-07 DIAGNOSIS — M949 Disorder of cartilage, unspecified: ICD-10-CM

## 2018-04-11 ENCOUNTER — Encounter: Admit: 2018-04-11 | Discharge: 2018-04-11 | Payer: BC Managed Care – PPO

## 2018-04-11 DIAGNOSIS — Z9889 Other specified postprocedural states: Principal | ICD-10-CM

## 2018-05-13 ENCOUNTER — Encounter: Admit: 2018-05-13 | Discharge: 2018-05-13 | Payer: BC Managed Care – PPO

## 2018-05-23 ENCOUNTER — Encounter: Admit: 2018-05-23 | Discharge: 2018-05-23 | Payer: BC Managed Care – PPO

## 2018-07-17 ENCOUNTER — Encounter: Admit: 2018-07-17 | Discharge: 2018-07-17 | Payer: BC Managed Care – PPO

## 2018-07-17 DIAGNOSIS — C801 Malignant (primary) neoplasm, unspecified: ICD-10-CM

## 2018-07-17 DIAGNOSIS — O903 Peripartum cardiomyopathy: ICD-10-CM

## 2018-07-17 DIAGNOSIS — D242 Benign neoplasm of left breast: ICD-10-CM

## 2018-07-17 DIAGNOSIS — I509 Heart failure, unspecified: ICD-10-CM

## 2018-07-17 DIAGNOSIS — G43909 Migraine, unspecified, not intractable, without status migrainosus: Principal | ICD-10-CM

## 2018-07-17 DIAGNOSIS — E039 Hypothyroidism, unspecified: ICD-10-CM

## 2018-10-14 ENCOUNTER — Ambulatory Visit: Admit: 2018-10-14 | Discharge: 2018-10-15

## 2018-10-14 ENCOUNTER — Encounter: Admit: 2018-10-14 | Discharge: 2018-10-14

## 2018-10-14 DIAGNOSIS — L719 Rosacea, unspecified: Secondary | ICD-10-CM

## 2018-10-14 DIAGNOSIS — Z808 Family history of malignant neoplasm of other organs or systems: Secondary | ICD-10-CM

## 2018-10-14 DIAGNOSIS — O903 Peripartum cardiomyopathy: Secondary | ICD-10-CM

## 2018-10-14 DIAGNOSIS — G43909 Migraine, unspecified, not intractable, without status migrainosus: Secondary | ICD-10-CM

## 2018-10-14 DIAGNOSIS — E039 Hypothyroidism, unspecified: Secondary | ICD-10-CM

## 2018-10-14 DIAGNOSIS — D1801 Hemangioma of skin and subcutaneous tissue: Secondary | ICD-10-CM

## 2018-10-14 DIAGNOSIS — L570 Actinic keratosis: Secondary | ICD-10-CM

## 2018-10-14 DIAGNOSIS — D489 Neoplasm of uncertain behavior, unspecified: Principal | ICD-10-CM

## 2018-10-14 DIAGNOSIS — L281 Prurigo nodularis: Secondary | ICD-10-CM

## 2018-10-14 DIAGNOSIS — C801 Malignant (primary) neoplasm, unspecified: Secondary | ICD-10-CM

## 2018-10-14 DIAGNOSIS — D242 Benign neoplasm of left breast: Secondary | ICD-10-CM

## 2018-10-14 DIAGNOSIS — L700 Acne vulgaris: Secondary | ICD-10-CM

## 2018-10-14 DIAGNOSIS — I509 Heart failure, unspecified: Secondary | ICD-10-CM

## 2018-10-14 DIAGNOSIS — R238 Other skin changes: Secondary | ICD-10-CM

## 2018-10-14 DIAGNOSIS — L720 Epidermal cyst: Secondary | ICD-10-CM

## 2018-10-14 MED ORDER — CLINDAMYCIN PHOSPHATE 1 % TP LOTN
Freq: Two times a day (BID) | TOPICAL | 3 refills | 7.00000 days | Status: DC
Start: 2018-10-14 — End: 2019-04-27

## 2018-10-14 MED ORDER — METRONIDAZOLE 0.75 % TP CREA
Freq: Two times a day (BID) | 11 refills | 30.00000 days | Status: AC
Start: 2018-10-14 — End: ?

## 2018-10-14 MED ORDER — TRETINOIN 0.1 % TP CREA
Freq: Every evening | 11 refills | Status: DC
Start: 2018-10-14 — End: 2019-04-27

## 2018-10-14 NOTE — Procedures
Shave biopsy procedure note    Risk and benefits of the above procedure including bleeding, pain, dyspigmentation, scar, infection, recurrence or nerve damage with loss of muscle function and/or skin sensation were discussed with the patient (or legal guardian) in detail, who afterwards decided to proceed with the procedure.    Diagnosis: see progress note  Body Site: see progress note  Preparation:  Alcohol   Anesthesia:  1% lidocaine with epinephrine  Instrument:  Dermablade  Hemostasis:  AlCl3  Closure:  None  Wound dressing:  Vaseline  Wound care instructions given:  Verbal  Complications:  None  Tolerated well: Yes  Ambulated from room:  Yes  Pathology sent to:  [facility]ology  Duration of procedure:  >5 minutes    Procedure Time Out Check List:  Prior to the start of the procedure, I personally confirmed the following:    Site Marking Verified: Yes, as appropriate  Patient Identity (name & date of birth): Yes  Procedure: Yes  Site: Yes  Body Part: see above    The risks of the procedure, including infection, bleeding, pain and skin changes, were discussed with the patient.    Punch Biopsy Procedure Note    Risk and benefits of the above procedure including bleeding, pain, dyspigmentation, scar, infection, recurrence, or nerve damage with loss of muscle function and/or skin sensation were discussed with the patient (or legal guardian) in detail, who afterwards decided to proceed with the procedure.    Diagnosis: see progress note  Body Site: see progress note                Preparation:  Alcohol   Anesthesia:  1% lidocaine with epinephrine  Instrument:  5 mm punch  Excison:  Lesion fully excised to mid fat or fascia  Hemostasis:  Pressure  Closure: simple    Epidermal: 4-0    Wound dressing: Vaseline  Wound care instructions given  Suture removal in 7 days:  Complications: None  Tolerated well:  Yes  Ambulated from room:  Yes  Pathology sent to:  [facility] Pathology  Duration of procedure:  >5 minutes Procedure Time Out Check List:  Prior to the start of the procedure, I personally confirmed the following:    Site Marking Verified: Yes, as appropriate  Patient Identity (name & date of birth): Yes  Procedure: Yes  Site: Yes  Body Part: see above    The risks of the procedure, including infection, bleeding, pain and skin changes, were discussed with the patient.

## 2018-10-14 NOTE — Procedures
Liquid Nitrogen Procedure Note    Risk and benefits of the above procedure including pain, dyspigmentation, scar, infection, recurrence were discussed with the patient (or legal guardian) in detail, who afterwards decided to proceed with the procedure.    Verbal informed consent given  Diagnosis: see progress note  Body site: see progress note  Number of lesions: see progress note  Cycle duration: 10 sec  Number or cycles: 2   Wound care instructions given: Yes  Complications:  None  Tolerated well:  Yes  Ambulated from room:  Yes  Duration of procedure: > 5min

## 2018-10-14 NOTE — Progress Notes
ATTESTATION    I personally performed the key portions of the E/M visit, discussed case with resident and concur with resident documentation of history, physical exam, assessment, and treatment plan unless otherwise noted. I performed the key components of the biopsy including site identification, discussion with patient, biopsy type and choice, and was present during the procedure. I performed cryotherapy to 1 AK lesions today .Patient informed that a blistering reaction is to be expected and that a hypopigmented scar may result. Patient tolerated the procedure well with no complications.       Staff name:  Mikeal Hawthorne MD Date:  10/14/2018

## 2018-10-15 DIAGNOSIS — L821 Other seborrheic keratosis: Secondary | ICD-10-CM

## 2018-10-15 DIAGNOSIS — D229 Melanocytic nevi, unspecified: Secondary | ICD-10-CM

## 2018-10-17 NOTE — Progress Notes
Please send the benign letter, thank you.

## 2018-10-18 ENCOUNTER — Encounter: Admit: 2018-10-18 | Discharge: 2018-10-18

## 2018-10-18 NOTE — Progress Notes
Tretinoin was approved through insurance. Left voicemail with pharmacy of approval.

## 2018-10-21 ENCOUNTER — Encounter: Admit: 2018-10-21 | Discharge: 2018-10-21

## 2018-10-21 ENCOUNTER — Ambulatory Visit: Admit: 2018-10-21 | Discharge: 2018-10-22

## 2018-10-21 NOTE — Progress Notes
Pt in for suture removal from right cheek. Patient reports yesterday in shower that after toweled off that suture appeared to have fallen out.  Dr Sedalia Muta came in to assess site.  No signs or symptoms of infection, pt tolerated well.  Vaseline and a bandage applied.  Dr. Sedalia Muta discussed skin regimen with patient and patient verbalizes understanding of consistent product use and to not pick acne areas.  Pt to set up appointment for 3 months to re-evaluate acne concerns.  Bryson Dames, RN

## 2018-11-22 ENCOUNTER — Ambulatory Visit: Admit: 2018-11-22 | Discharge: 2018-11-22 | Payer: BC Managed Care – PPO

## 2018-11-22 ENCOUNTER — Encounter: Admit: 2018-11-22 | Discharge: 2018-11-22 | Payer: BC Managed Care – PPO

## 2018-11-22 MED ORDER — TRIAMCINOLONE ACETONIDE 10 MG/ML IJ SUSP
5 mg | Freq: Once | INTRALESIONAL | 0 refills | Status: CP
Start: 2018-11-22 — End: ?
  Administered 2018-11-22: 19:00:00 5 mg via INTRALESIONAL

## 2018-11-22 NOTE — Procedures
Intralesional Triamcinolone Procedure Note    Risk and benefits of the above procedure including bleeding, pain, dyspigmentation, scar, depression in skin, infection, recurrence or nerve damage with loss of muscle function and/or skin sensation were discussed with the patient (or legal guardian) in detail, who afterwards decided to proceed with the procedure.    Diagnosis: Cyst  Body Site: Mid chest   Concentration: Kenalog 2.5  Volume injected: 0.2 cc  Kenalog 2.5  Hemostasis:  Pressure  Wound dressing: Bandaid  Wound care instructions given:  Verbal  Complications:  None  Tolerated well:  Yes  Ambulated from room:  Yes  Duration of procedure:  >5 minutes

## 2018-11-22 NOTE — Procedures
Punch Biopsy Procedure Note    Risk and benefits of the above procedure including bleeding, pain, dyspigmentation, scar, infection, recurrence, or nerve damage with loss of muscle function and/or skin sensation were discussed with the patient (or legal guardian) in detail, who afterwards decided to proceed with the procedure.    Diagnosis:   see progress note  Body Site: see progress note              Preparation:  Alcohol   Anesthesia:  1% lidocaine with epinephrine  Instrument:  4 mm punch  Excison:  Lesion fully excised to mid fat or fascia  Hemostasis:  Pressure  Closure: simple    Dermal: 4-0 monocryl   Epidermal: 4-0 ethilon  Wound dressing: Vaseline  Wound care instructions given  Suture removal in 14  days:  Complications: None  Tolerated well:  Yes  Ambulated from room:  Yes  Pathology sent to:  [facility] Pathology  Duration of procedure:  >5 minutes      Procedure Time Out Check List:  Prior to the start of the procedure, I personally confirmed the following:    Site Marking Verified: Yes, as appropriate  Patient Identity (name & date of birth): Yes  Procedure: Yes  Site: Yes  Body Part:  see above    The risks of the procedure, including infection, bleeding, pain and skin changes, were discussed with the patient.

## 2018-11-22 NOTE — Patient Instructions
POST OPERATIVE WOUND CARE INSTRUCTIONS FOR SKIN SURGERY    Day of procedure:  ? The dressing applied to today stays in place for 24-48 hours.    ? Keep the wound and dressing dry  ? If you experience any bleeding, it should be minimal.  You may use clean gauze to apply direct pressure to the wound for 15 minutes as needed  ? If possible, do not take aspirin, products containing aspirin, and NSAIDs like ibuprofen, naproxen.  If you are on baby aspirin daily, you do not have to stop the regimen, just be aware that you might experience moderate bleeding.  You may take Tylenol (acetaminophen) every 4-6 hours as needed for pain.  ? If Steri-Strips were applied, do not peel or disturb these strips.  They are there to add secondary support to the wound.  Please let the strips fall off naturally, this should take anywhere from 1-7 days.  Once the strips have fallen off, you may clean the area lightly with mild soap and water and apply Vaseline (petroleum jelly, not lotion) to the clean dry area.    Day after procedure:  ? Wait 24-48 hours after the procedure to remove any dressing or band-aid.  ? You may shower.  Do not submerge the wound in water;  this includes baths, hot tubs, swimming pools and lakes.  General wound care:  ? Wash your hands with soap and water  ? Gently clean the wound once daily with mild soap and water, pat dry with a clean towel  ? Apply Vaseline to wound to prevent scabbing and scaring  ? Keep covered with a clean bandage  ? Apple clean Vaseline (petroleum jelly, not lotion) and a bandage daily until site is healed or until sutures are removed   Sutures:  ? Until the sutures/stitches are REMOVED, please avoid activities that risk breaking your sutures, including (but not limited to) vigorous exercising, stretching, and lifting anything above 10 lbs.  ? If sutures were placed on the arms, legs, scalp, or trunk, they are usually removed in 10-14 days. ? If sutures were placed on the face, they are usually removed in 5-7 days.    If signs of infection including foul odor, pus, redness, pain, please call the nurse between 8:00 am-4:30 pm at [phone] and follow the prompts.  If it is before or after hours and is urgent, call the hospital switchboard at [phone] and ask the page operator to page the on-call Dermatology resident.

## 2018-11-28 ENCOUNTER — Encounter: Admit: 2018-11-28 | Discharge: 2018-11-28 | Payer: BC Managed Care – PPO

## 2018-12-02 ENCOUNTER — Encounter: Admit: 2018-12-02 | Discharge: 2018-12-02 | Payer: BC Managed Care – PPO

## 2018-12-06 ENCOUNTER — Encounter: Admit: 2018-12-06 | Discharge: 2018-12-06 | Payer: BC Managed Care – PPO

## 2018-12-06 ENCOUNTER — Ambulatory Visit: Admit: 2018-12-06 | Discharge: 2018-12-06 | Payer: BC Managed Care – PPO

## 2018-12-06 NOTE — Progress Notes
Pt in clinic today for suture removal to mid chest. Pt denies any pain. No s/s of infection. Vaseline and a bandaid applied. Pt tolerated well.

## 2018-12-12 ENCOUNTER — Encounter: Admit: 2018-12-12 | Discharge: 2018-12-12 | Payer: BC Managed Care – PPO

## 2018-12-19 ENCOUNTER — Encounter: Admit: 2018-12-19 | Discharge: 2018-12-19 | Payer: BC Managed Care – PPO

## 2019-01-22 ENCOUNTER — Encounter: Admit: 2019-01-22 | Discharge: 2019-01-22 | Payer: BC Managed Care – PPO

## 2019-01-23 ENCOUNTER — Ambulatory Visit: Admit: 2019-01-23 | Discharge: 2019-01-24 | Payer: BC Managed Care – PPO

## 2019-01-23 ENCOUNTER — Encounter: Admit: 2019-01-23 | Discharge: 2019-01-23 | Payer: BC Managed Care – PPO

## 2019-01-23 DIAGNOSIS — L719 Rosacea, unspecified: Secondary | ICD-10-CM

## 2019-01-23 DIAGNOSIS — D242 Benign neoplasm of left breast: Secondary | ICD-10-CM

## 2019-01-23 DIAGNOSIS — E039 Hypothyroidism, unspecified: Secondary | ICD-10-CM

## 2019-01-23 DIAGNOSIS — G43909 Migraine, unspecified, not intractable, without status migrainosus: Secondary | ICD-10-CM

## 2019-01-23 DIAGNOSIS — C801 Malignant (primary) neoplasm, unspecified: Secondary | ICD-10-CM

## 2019-01-23 DIAGNOSIS — O903 Peripartum cardiomyopathy: Secondary | ICD-10-CM

## 2019-01-23 DIAGNOSIS — L739 Follicular disorder, unspecified: Secondary | ICD-10-CM

## 2019-01-23 DIAGNOSIS — I509 Heart failure, unspecified: Secondary | ICD-10-CM

## 2019-01-23 MED ORDER — FUROSEMIDE 20 MG PO TAB
ORAL_TABLET | Freq: Every day | ORAL | 0 refills | 90.00000 days | Status: AC | PRN
Start: 2019-01-23 — End: ?

## 2019-01-23 MED ORDER — SPIRONOLACTONE 50 MG PO TAB
50 mg | ORAL_TABLET | Freq: Two times a day (BID) | ORAL | 1 refills | 90.00000 days | Status: DC
Start: 2019-01-23 — End: 2019-04-27

## 2019-01-23 NOTE — Telephone Encounter
-----   Message from Jamison Neighbor, MD sent at 01/23/2019  2:50 PM CST -----  Always fine with it so long as we monitor labs.  I will have my nurse order labs to check in 2 weeks to verify potassium and renal function are stable. [facility], please check labs in 14 days, can still follow with me as scheduled early 2021 unless there is a concern.  Thanks.  ----- Message -----  From: Wille Celeste, MD  Sent: 01/23/2019  11:37 AM CST  To: Burman Nieves, MD, Jamison Neighbor, MD    Hi Dr. Adah Perl,    I'm a derm resident working with Dr. Sedalia Muta. We saw Monica Powell today who is very distressed over her acne. She'd be  a good candidate for spironolactone. Given her cardiac history and that she is on lasix already, wanted to notify you we planned to start spironolactone for her if you are okay with it.    Thanks,  [employer] PGY-3

## 2019-01-23 NOTE — Progress Notes
ATTESTATION    I personally performed the key portions of the E/M visit, discussed case with resident and concur with resident documentation of history, physical exam, assessment, and treatment plan unless otherwise noted.    Staff name:  Mikeal Hawthorne MD Date:  01/23/2019

## 2019-01-23 NOTE — Telephone Encounter
Called and left message for patient to have lab work drawn in 2 weeks. Additional message sent on mychart,

## 2019-01-23 NOTE — Patient Instructions
This is the brand of benzoyl peroxide wash we recommend for you to use as a face and/or body wash three times a week in the shower. This can bleach sheets if not washed off thoroughly.

## 2019-01-23 NOTE — Progress Notes
Date of Service: 01/23/2019    Subjective:             Monica Powell is a 48 y.o. female.    History of Present Illness  Return patient, LV 01/2015 with Dr Evie Lacks  ?  # Patient has a history of brown and tan spots distributed over the head, trunk, arms and legs.?   -These have been present for many years.?   - These get darker with sun exposure.?   - There is?a history of blistering sunburns.  - Father and Paternal Grandfather have hx of Melanoma     # Patient has a history of acne with red, bumpy lesions.?   # doxycycline  # Folliculitis  - Current treatments include:?none.   - Past treatments include:?oral abx.?   - Mostly along jaw line, chin, back and chest  - Acne flares with menstrual cycles, cycles are regular   - Cannot take birth control because of previous heart failure during pregnancy  - Does not follow with cardiologist, patient does have low blood pressure    - Patient does pick at lesions  - Has sores in scalp   - Patient just had breast biopsy  - Patient does not have any first degree relatives with breast cancer   Interval:  - using clindamycin (BID) and tretinoin (2-3 times per week)  - not using metrogel  - interested in oral medication  - gets acne bumps elsewhere on body as well besides face  - not using benzoyl peroxide, was not aware    # 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  No personal hx of skin cancer  Father and Paternal Grandfather have hx of Melanoma   SH: works at [employer]     Review of Systems   Constitutional: Negative for appetite change and unexpected weight change.   Gastrointestinal: Negative for diarrhea, nausea and vomiting.         Objective:         ? clindamycin (CLEOCIN T) 1 % topical lotion Apply  topically to affected area twice daily.   ? furosemide (LASIX) 20 mg tablet TAKE 1 TABLET BY MOUTH DAILY AS NEEDED FOR WEIGHT GAIN,SWELLING OR SHORTNESS OF BREATH ? ibuprofen (ADVIL) 200 mg tablet Take 400 mg by mouth every 8 hours as needed for Pain. Take with food.   ? levothyroxine (SYNTHROID) 137 mcg tablet Take 137 mcg by mouth daily 30 minutes before breakfast.   ? metroNIDAZOLE (METROCREAM) 0.75 % topical cream apply to affected areas on the face twice daily   ? Multivitamin Cmb No.21-Iron-FA (DAILY MULTIPLE) 18-400 mg-mcg tab Take 1 tablet by mouth daily.   ? tretinoin (RETIN-A) 0.1 % topical cream apply pea sized amount to face at bedtime, 2-3 nights per week. Advance to nightly as tolerated.     Vitals:    01/23/19 1112   Temp: 36.1 ?C (97 ?F)   TempSrc: Temporal   Weight: 89.8 kg (198 lb)   Height: 167.6 cm (66)     Body mass index is 31.96 kg/m?Marland Kitchen     Physical Exam    Areas Examined (all normal unless noted below):  General: Alert and Oriented x 3, Well-nourished  Eyes: Normal Conjunctivae, EOMI  Psych: normal mood  Head/Face  Neck  Chest/breasts/axillae  Back  Abdomen  Buttocks  R upper ext  L upper ext  R lower ext  L lower ext  Pertinent findings include:  Multiple brown and tan evenly pigmented macules are distributed over the examined areas.  All have symmetric similar dermascopic findings with primarily globular and reticular patterns.    Assessment and Plan:    #Acne Vulgaris  # Folliculitis   - Etiology, course, prognosis, and tx options d/w pt today  - Advised that acne requires constant, regular care over months to see improvements  - continue Rx tretinoin 0.1% cream - apply pea-sized amount TIW-nightly as tolerated. If causing too much dryness, apply tretinoin cream over thin layer of CeraVe lotion.   - (Rx) start spironolactone 50mg  BID. A/R/B of medication discussed in detail including menstrual irregularities, GI symptoms, hyperkalemia, orthostatic hypotension, headache, dizziness, fatigue, muscle cramps    Discussed she should not become pregnant on this medication Should pt become pregnant inadvertently or plan to become pregnant, spironolactone needs to be stopped right away.    Pt does not have a personal or family hx of breast cancer    Patient has no history of cardiac or renal disease     Discussed that pt needs to hold spironolactone if taking any sulfa drug    Discussed theoretical increased risk of breast cancer although as of now there are data that showed no increased risks of breast cancer in pts with family hx of breast cancer  - given history peripartum cardiomyopathy, will notify cardiologist that we are starting this medication. Advised to monitor her BP and for symptoms of dehydration given she is already on lasix. Order BMP at next visit after being on this medication    - if no improvement on spironolactone, could consider doxycycline   - clindamycin 1% lotion 1-2 times per day as needed  - BPO 10% wash qday    #Rosacea  - continue metronidazole 0.75% gel twice daily     # Melanocytic nevi  - Reassured examined lesions appear normal  - counseled lesions that change in size, shape, color or are tender warrant further evaluation  - RTC for new/changing lesions      RTC 3 months

## 2019-01-24 DIAGNOSIS — L700 Acne vulgaris: Secondary | ICD-10-CM

## 2019-02-07 ENCOUNTER — Encounter: Admit: 2019-02-07 | Discharge: 2019-02-07 | Payer: BC Managed Care – PPO

## 2019-02-20 ENCOUNTER — Encounter: Admit: 2019-02-20 | Discharge: 2019-02-20 | Payer: BC Managed Care – PPO

## 2019-04-13 NOTE — Progress Notes
Date of Service: 04/19/2019    Monica Powell is a 49 y.o. female.       HPI     Monica Powell returns for follow-up in the advanced heart failure clinic in [place].  She is a 49 year old woman who is followed by Dr Pierre Bali for history of peripartum cardiomyopathy, and she has also had dyspnea on exertion and atypical chest pain.  She is a prior tobacco user who has struggled with osteoarthritis and history of left breast cancer. She does have questionable history of PFO and certainly has complex migraines, but she has not had PFO closure.     She was last seen by Dr Pierre Bali on 03/08/18 at which time she was felt to be euvolemic and stable, no changes were made.     She presented today reporting that she has been under a lot of stress associated with her 73 yo son who was in pediatric ICU recently following an acid overdose. We spent a significant amount of time discussing the stress, dilemma they are facing associated with his school/friends/choices, etc and getting therapy as well. She has not been using lasix like she normally does and was started on spironolactone 50 mg BID for boils. She has been having chest heaviness at night. Weight was down to 170 pounds before Christmas and now up to 200 pounds during this traumatic experience. She is not SOB while walking, denies orthopnea/PND or syncope. She denies illicit drug use herself although has been advised to start medication for ADD that is making her coping skills more challenging and needs cardiac approval before starting something for ADD.          Vitals:    04/19/19 1341   BP: 132/70   BP Source: Arm, Right Upper   Patient Position: Sitting   Pulse: 100   Temp: 36.3 ?C (97.3 ?F)   TempSrc: Axillary   SpO2: 98%   Weight: 87.9 kg (193 lb 12.8 oz)   Height: 1.676 m (5' 6)   PainSc: Zero     Body mass index is 31.28 kg/m?Marland Kitchen     Review of Systems   Constitution: Positive for malaise/fatigue and weight gain. Negative for chills, diaphoresis and fever.   HENT: Negative.    Eyes: Negative.    Cardiovascular: Positive for leg swelling. Negative for chest pain (heaviness at night), dyspnea on exertion, irregular heartbeat and syncope.   Respiratory: Negative.    Endocrine: Negative.    Hematologic/Lymphatic: Negative.    Skin: Negative.    Musculoskeletal: Negative.    Gastrointestinal: Negative.    Genitourinary: Negative.    Neurological: Negative.    Psychiatric/Behavioral: Positive for depression. The patient has insomnia.    Allergic/Immunologic: Negative.        Physical Exam  General Appearance: normal appearing female in no acute distress, well groomed, appropriate for stated age  Skin: warm, dry, pink  Eyes/Ears: conjunctivae and lids normal, pupils are equal and round, normal hearing  Lips & Oral Mucosa: no pallor or cyanosis   Neck Veins:  JVP ~ 6-7 cm at 90 degrees, HJR negative   Chest Inspection: chest is normal in appearance   Respiratory Effort: breathing comfortably, no respiratory distress   Auscultation: lungs clear to auscultation, no rales or rhonchi, no wheezing   Cardiac Auscultation: regular rhythm and normal rate, normal S1-S2, no obvious rub or gallop, no lift or heave, no murmur or extra heart sounds  Peripheral Circulation:extremities with normal color and warm to  touch without ulcerous wounds  Radial Arteries: palpable symmetric radial pulses   Lower Extremity Edema: trace lower extremity edema bil  Abdominal Exam: soft, non-tender, non distended, bowel sounds normal   Gait & Station: walks without assistance   Orientation: oriented to time, place and person   Affect & Mood: appropriate and cooperative  Language and Memory: patient responsive and seems to comprehend information   Neurologic Exam: neurological assessment grossly intact     Cardiovascular Studies      Problems Addressed Today  Encounter Diagnoses   Name Primary?   ? Peripartum cardiomyopathy Yes   ? Patent foramen ovale    ? Hypertension, unspecified type        Assessment and Plan     Peripartum Cardiomyopathy, recovered EF  Last LVEF 76% on echo from 11/30/17  NYHA class II Stage B  GDMT: spironolactone 50 mg bid   Diuretics: prn lasix  > noting increased stress/emotional trauma associated with weight gain and chest heaviness, rec repeat echocardiogram  - ECG today without evidence of ischemia, NSR  - f/u with Dr. Pierre Bali or Fara Boros, NP following echocardiogram  > Baseline labs today (CMP, CBC, TSH with reflex T4 and hemoglobin A1c)    Probable PFO with h/o migraines    Stress/Anxiety  > Advising against use of stimulants at this time, may consider an antidepressant or antianxiety medicine, encouraged ongoing counseling, regular daily exercise as coping mechanisms, advised against substance use as a coping mechanism             Current Medications (including today's revisions)  ? clindamycin (CLEOCIN T) 1 % topical lotion Apply  topically to affected area twice daily.   ? furosemide (LASIX) 20 mg tablet TAKE 1 TABLET BY MOUTH DAILY AS NEEDED FOR WEIGHT GAIN,SWELLING OR SHORTNESS OF BREATH   ? ibuprofen (ADVIL) 200 mg tablet Take 400 mg by mouth every 8 hours as needed for Pain. Take with food.   ? levothyroxine (SYNTHROID) 137 mcg tablet Take 137 mcg by mouth daily 30 minutes before breakfast.   ? metroNIDAZOLE (METROCREAM) 0.75 % topical cream apply to affected areas on the face twice daily   ? Multivitamin Cmb No.21-Iron-FA (DAILY MULTIPLE) 18-400 mg-mcg tab Take 1 tablet by mouth daily.   ? spironolactone (ALDACTONE) 50 mg tablet Take one tablet by mouth twice daily. Take with food.   ? tretinoin (RETIN-A) 0.1 % topical cream apply pea sized amount to face at bedtime, 2-3 nights per week. Advance to nightly as tolerated.   ? VIIBRYD 20 mg tablet Take 20 mg by mouth daily.     Sue Lush, APRN-NP / CVM Advanced Practice Provider  Advanced Heart Failure APP  [facility]  Phone [phone]  Fax [phone] [email or web address]  [address], [address]  [place], [place] [address]    Collaborating physician [facility], MD

## 2019-04-19 ENCOUNTER — Ambulatory Visit: Admit: 2019-04-19 | Discharge: 2019-04-19 | Payer: BC Managed Care – PPO

## 2019-04-19 ENCOUNTER — Encounter: Admit: 2019-04-19 | Discharge: 2019-04-19 | Payer: BC Managed Care – PPO

## 2019-04-19 DIAGNOSIS — I509 Heart failure, unspecified: Secondary | ICD-10-CM

## 2019-04-19 DIAGNOSIS — C801 Malignant (primary) neoplasm, unspecified: Secondary | ICD-10-CM

## 2019-04-19 DIAGNOSIS — O903 Peripartum cardiomyopathy: Secondary | ICD-10-CM

## 2019-04-19 DIAGNOSIS — D242 Benign neoplasm of left breast: Secondary | ICD-10-CM

## 2019-04-19 DIAGNOSIS — Q211 Atrial septal defect: Secondary | ICD-10-CM

## 2019-04-19 DIAGNOSIS — I10 Essential (primary) hypertension: Secondary | ICD-10-CM

## 2019-04-19 DIAGNOSIS — G43909 Migraine, unspecified, not intractable, without status migrainosus: Secondary | ICD-10-CM

## 2019-04-19 DIAGNOSIS — E039 Hypothyroidism, unspecified: Secondary | ICD-10-CM

## 2019-04-19 LAB — COMPREHENSIVE METABOLIC PANEL
Lab: 0.5 mg/dL — ABNORMAL LOW (ref 0.3–1.2)
Lab: 0.9 mg/dL (ref 0.4–1.00)
Lab: 103 MMOL/L (ref 98–110)
Lab: 12 U/L (ref 7–56)
Lab: 12 mg/dL — ABNORMAL HIGH (ref 7–25)
Lab: 138 MMOL/L (ref 137–147)
Lab: 15 U/L (ref 7–40)
Lab: 28 MMOL/L (ref 21–30)
Lab: 4.1 MMOL/L (ref 3.5–5.1)
Lab: 4.1 g/dL (ref 3.5–5.0)
Lab: 51 U/L (ref 25–110)
Lab: 7.1 g/dL (ref 6.0–8.0)
Lab: 9.9 mg/dL (ref 8.5–10.6)
Lab: 91 mg/dL (ref 70–100)
Lab: >60 mL/min (ref >60)
Lab: >60 mL/min (ref >60)
Lab: 7 (ref 3–12)

## 2019-04-19 LAB — CBC: Lab: 9.3 10*3/uL (ref 4.5–11.0)

## 2019-04-19 LAB — TSH WITH FREE T4 REFLEX: Lab: 3.1 uU/mL (ref 0.35–5.00)

## 2019-04-19 LAB — BNP (B-TYPE NATRIURETIC PEPTI): Lab: 11 pg/mL (ref 0–100)

## 2019-04-25 ENCOUNTER — Encounter: Admit: 2019-04-25 | Discharge: 2019-04-25 | Payer: BC Managed Care – PPO

## 2019-04-25 ENCOUNTER — Ambulatory Visit: Admit: 2019-04-25 | Discharge: 2019-04-25 | Payer: BC Managed Care – PPO

## 2019-04-25 NOTE — Progress Notes
The echo results have been released to MyChart; there is no change in treatment plan.

## 2019-04-27 ENCOUNTER — Ambulatory Visit: Admit: 2019-04-27 | Discharge: 2019-04-28 | Payer: BC Managed Care – PPO

## 2019-04-27 ENCOUNTER — Encounter: Admit: 2019-04-27 | Discharge: 2019-04-27 | Payer: BC Managed Care – PPO

## 2019-04-27 MED ORDER — SPIRONOLACTONE 100 MG PO TAB
ORAL_TABLET | Freq: Two times a day (BID) | ORAL | 0 refills | 46.00000 days | Status: DC
Start: 2019-04-27 — End: 2019-07-31

## 2019-04-27 MED ORDER — TRIAMCINOLONE ACETONIDE 10 MG/ML IJ SUSP
2.5 mg | Freq: Once | INTRALESIONAL | 0 refills | Status: CP
Start: 2019-04-27 — End: ?
  Administered 2019-04-27: 20:00:00 2.5 mg via INTRALESIONAL

## 2019-04-27 MED ORDER — CLINDAMYCIN PHOSPHATE 1 % TP LOTN
Freq: Two times a day (BID) | TOPICAL | 3 refills | 7.00000 days | Status: AC
Start: 2019-04-27 — End: ?

## 2019-04-27 MED ORDER — TRETINOIN 0.1 % TP CREA
Freq: Every evening | 3 refills | Status: AC
Start: 2019-04-27 — End: ?

## 2019-04-27 MED ORDER — SPIRONOLACTONE 100 MG PO TAB
100 mg | ORAL_TABLET | Freq: Two times a day (BID) | ORAL | 2 refills | 90.00000 days | Status: DC
Start: 2019-04-27 — End: 2019-04-27

## 2019-04-27 NOTE — Progress Notes
Date of Service: 04/27/2019    Subjective:             Monica Powell is a 49 y.o. female.    History of Present Illness  Return patient, [place] 01/23/2019 with Dr Lowell Guitar  ?  # Cyst on face  - pt reports has popped it 3 times with some drainage  - reports picking at it    # Patient has a history of brown and tan spots distributed over the head, trunk, arms and legs.??  -These have been present for many years.??  - These get darker with sun exposure.??  - There is?a?history of blistering sunburns.  - Father and Paternal Grandfather have hx of Melanoma   ?  # Acne Excorie  # Folliculitis  - Current treatments include:?none.   - Past treatments include:?oral abx.??  - Mostly along jaw line, chin, back and chest  - Acne flares with menstrual cycles, cycles are regular   - Cannot take birth control because of previous heart failure during pregnancy  - Does not follow with cardiologist, patient does have low blood pressure ?  - Patient does pick at lesions  - Has sores in scalp   - Patient just had breast biopsy  - Patient does not have any first degree relatives with breast cancer   Interval:  - using clindamycin (BID) and tretinoin (2-3 times per week)  - taking oral spironolactone 50 mg BID  - tolerating meds well  - has improvement but still getting a few pimples here and there        No personal hx of skin cancer  Father and Paternal Grandfather have hx of Melanoma   SH: works at [employer]     Review of Systems   Constitutional: Negative for appetite change, diaphoresis, fatigue, fever and unexpected weight change.   HENT: Negative for congestion, mouth sores and sore throat.    Eyes: Negative for pain, redness, itching and visual disturbance.   Respiratory: Negative for cough and shortness of breath.    Cardiovascular: Negative for palpitations and leg swelling.   Gastrointestinal: Negative for abdominal pain, blood in stool, diarrhea, nausea and vomiting.   Genitourinary: Negative for difficulty urinating and hematuria.   Musculoskeletal: Negative for arthralgias and myalgias.   Neurological: Negative for dizziness and seizures.   Hematological: Does not bruise/bleed easily.   Psychiatric/Behavioral: Negative for confusion and dysphoric mood. The patient is not nervous/anxious.          Objective:         ? clindamycin (CLEOCIN T) 1 % topical lotion Apply  topically to affected area twice daily.   ? furosemide (LASIX) 20 mg tablet TAKE 1 TABLET BY MOUTH DAILY AS NEEDED FOR WEIGHT GAIN,SWELLING OR SHORTNESS OF BREATH   ? ibuprofen (ADVIL) 200 mg tablet Take 400 mg by mouth every 8 hours as needed for Pain. Take with food.   ? levothyroxine (SYNTHROID) 137 mcg tablet Take 137 mcg by mouth daily 30 minutes before breakfast.   ? metroNIDAZOLE (METROCREAM) 0.75 % topical cream apply to affected areas on the face twice daily   ? Multivitamin Cmb No.21-Iron-FA (DAILY MULTIPLE) 18-400 mg-mcg tab Take 1 tablet by mouth daily.   ? spironolactone (ALDACTONE) 50 mg tablet Take one tablet by mouth twice daily. Take with food.   ? tretinoin (RETIN-A) 0.1 % topical cream apply pea sized amount to face at bedtime, 2-3 nights per week. Advance to nightly as tolerated.   ? VIIBRYD  20 mg tablet Take 20 mg by mouth daily.     There were no vitals filed for this visit.  There is no height or weight on file to calculate BMI.     Physical Exam  Areas Examined (all normal unless noted below):  General: Alert and Oriented x 3, Well-nourished  Eyes: Normal Conjunctivae, EOMI  Psych: normal mood  Head/Face  Neck  ?  Pertinent findings include:  Few erythematous smooth 1-4 mm papules are noted on the face, one inflamed nodule with central punctum on R upper cutaneous lip  Minimal Erythematous patches with telangiectasia are noted over the cheeks, chin, anterior neck and superficial chest.         Assessment and Plan:    # Cyst on R upper lip  - Discussed diagnosis, etiology, and treatment option of ILTAC  - 0.2 ml of Kenalog 2.5mg  injected today- explained risks of scarring, skin atrophy/thinning, hypopigmentation    #Acne Vulgaris  # Folliculitis   - Etiology, course, prognosis, and tx options d/w pt today  - Advised that acne requires constant, regular care over months to see improvements  - continue Rx tretinoin 0.1% cream - apply pea-sized amount TIW-nightly as tolerated -refilled today. If causing too much dryness, apply tretinoin cream over thin layer of CeraVe lotion.   - (Rx) continue spironolactone 100mg  in AM then 50mg  in PM. If tolerating well, can take 100mg  BID. A/R/B of medication discussed in detail including menstrual irregularities, GI symptoms, hyperkalemia, orthostatic hypotension, headache, dizziness, fatigue, muscle cramps    Discussed she should not become pregnant on this medication     Should pt become pregnant inadvertently or plan to become pregnant, spironolactone needs to be stopped right away.    Pt does not have a personal or family hx of breast cancer    Patient has no history of cardiac or renal disease     Discussed that pt needs to hold spironolactone if taking any sulfa drug    Discussed theoretical increased risk of breast cancer although as of now there are data that showed no increased risks of breast cancer in pts with family hx of breast cancer  - given history peripartum cardiomyopathy, will notify cardiologist that we are starting this medication. Advised to monitor her BP and for symptoms of dehydration given she is already on lasix. Order BMP at next visit after being on this medication    - if no improvement on spironolactone, could consider doxycycline   - Continue clindamycin 1% lotion 1-2 times per day as needed, refilled today  - Continue BPO 10% wash qday  ?  #Rosacea  - continue metronidazole 0.75% gel twice daily   ?    ?  RTC 3 months  ?           In the presence of Glyn Ade, MD,  I have taken down these notes, Delice Bison, [employer]. 04/27/2019 1:19 PM

## 2019-04-27 NOTE — Procedures
Intralesional Triamcinolone Procedure Note     Risk and benefits of the above procedure including bleeding, pain, dyspigmentation, scar, depression in skin, infection, recurrence or nerve damage with loss of muscle function and/or skin sensation were discussed with the patient (or legal guardian) in detail, who afterwards decided to proceed with the procedure.     Diagnosis: Cyst  Body Site: see progress note  Concentration: Kenalog 2.5mg   Volume injected: 0.2 mL  Kenalog                Lot number: [id number]               Expiration Date: 05/2020  Diluent: Bacteriostatic Saline               Lot Number: [id number]               Expiration Date: 10/18/2019  Hemostasis:  Pressure  Wound dressing: Bandaid  Wound care instructions given:  Verbal  Complications:  None  Tolerated well:  Yes  Ambulated from room:  Yes  Duration of procedure:  >5 minutes

## 2019-05-18 ENCOUNTER — Encounter: Admit: 2019-05-18 | Discharge: 2019-05-18 | Payer: BC Managed Care – PPO

## 2019-05-18 ENCOUNTER — Ambulatory Visit: Admit: 2019-05-18 | Discharge: 2019-05-19 | Payer: BC Managed Care – PPO

## 2019-05-18 DIAGNOSIS — D242 Benign neoplasm of left breast: Secondary | ICD-10-CM

## 2019-05-18 DIAGNOSIS — E039 Hypothyroidism, unspecified: Secondary | ICD-10-CM

## 2019-05-18 DIAGNOSIS — F988 Other specified behavioral and emotional disorders with onset usually occurring in childhood and adolescence: Secondary | ICD-10-CM

## 2019-05-18 DIAGNOSIS — C801 Malignant (primary) neoplasm, unspecified: Secondary | ICD-10-CM

## 2019-05-18 DIAGNOSIS — G43909 Migraine, unspecified, not intractable, without status migrainosus: Secondary | ICD-10-CM

## 2019-05-18 DIAGNOSIS — O903 Peripartum cardiomyopathy: Secondary | ICD-10-CM

## 2019-05-18 DIAGNOSIS — I509 Heart failure, unspecified: Secondary | ICD-10-CM

## 2019-05-18 DIAGNOSIS — I10 Essential (primary) hypertension: Secondary | ICD-10-CM

## 2019-05-18 NOTE — Progress Notes
Date of Service: 05/18/2019    Monica Powell is a 49 y.o. female.       HPI     Monica Powell seen today in the heart failure clinic for 1 month follow-up.  She was last seen by Fara Boros, APRN, on 04/19/2019.  At that appointment, her weight was noted to be 193 pounds.  No medication changes were made.  She was asked to have a repeat echocardiogram and follow-up after that was complete.    She has a past medical history of peripartum cardiomyopathy, history of prior tobacco use, osteoarthritis, and left breast cancer.  There is also questionable history of PFO.  He did complete her echocardiogram on 04/25/2019 which revealed an LVEF of 65% with normal left ventricular diastolic function, normal RV in size and function, elevated CVP, and trace MR.    Today, she presents in the clinic accompanied by her son.  She reports that she feels like she has a little bit of fluid in both her legs and hands, but did just return from a trip to [place], and she has been in the car for 14 hours.  She reports she continues to have some palpitations but states it is not as many as she has had in the past.  She is curious about taking medication for her ADD, and she is recently been started on Viibryd.  She reports it is helping with her anxiety.         Vitals:    05/18/19 1618   BP: 114/47   BP Source: Arm, Right Upper   Patient Position: Sitting   Pulse: 93   SpO2: 96%   Weight: 88.2 kg (194 lb 6.4 oz)   Height: 1.676 m (5' 6)   PainSc: Zero     Body mass index is 31.38 kg/m?Marland Kitchen     Past Medical History  Patient Active Problem List    Diagnosis Date Noted   ? Attention deficit disorder (ADD) without hyperactivity 05/18/2019   ? Patent foramen ovale 04/19/2019   ? Peripartum cardiomyopathy 09/23/2017   ? DOE (dyspnea on exertion) 09/23/2017   ? Atypical chest pain 09/23/2017   ? Status post knee surgery 07/14/2017   ? Chondral lesion 06/01/2017   ? Defect of articular cartilage 04/28/2017   ? Chronic pain of left knee 04/28/2017   ? Intraductal papilloma of left breast 01/08/2015     Diagnosis: Left breast intraductal papilloma at 4:00, dx on CNBx 01/2015    History: Ms. Milkowski is a caucasian female who presented to the [facility] on 01/14/2015 at age 25 for a second opinion on outside breast imaging findings. She was having some pain and noticed palpable lumps in her breasts and called her provider. Breast imaging was ordered and mammograms showed a nodularity in both breasts. Bilateral breast ultrasound showed several, benign appearing findings in both breasts and 6 month follow-up was recommended. Ms. Schimmoeller preferred to get a second opinion and was referred to [facility]. Bilateral diagnostic mammogram at [facility] showed no abnormal findings. Bilateral breast ultrasound showed 2 subareolar intraductal masses and biopsy of at least one of the masses was recommended. Left breast sono-guided biopsy 01/25/15 revealed fragments of intraductal papilloma. The findings were considered concordant and surgical consultation was recommended. Ms. Shariff underwent left RSL lumpectomy on 04/15/15. Final pathology revealed several small intraductal papillomas without atypia or malignancy.    Breast Imaging:  Mammogram:   -- Bilateral diagnostic mammograms 12/28/14 [facility]) showed  changes related to the left breast biopsy. Nodularity was noted in the right breast at 9:00. There was also a nodularity see in the left breast at 3:00. Mild nodular asymmetry was seen in the upper inner quadrant of the left breast, which was stable in appearance. Benign appearing lymph nodes were seen in both axilla.   -- Bilateral diagnostic mammograms 01/14/15 ([place]) showed heterogeneously dense parenchyma. There was a clip marker in the lateral aspect the left breast approximately 3:30 from prior reported benign biopsy. No associated mass or abnormalities identified. There was a stable intraparenchymal lymph node at 9:00 right breast in the area of indicated pain identified by the patient. No other suspicious masses were identified.  Ultrasound:   -- Bilateral breast ultrasound 12/28/14 [facility]) showed a 6.5 mm lymph node at 10:00, 9 cm FTN. There was also an ovoid hypoechoic area with dense internal echoes which was avascular measuring 5 mm at 9:00. In the left breast, there were dilated ducts at 12:00, 2 cm FTN, the largest measured 5 mm. There was also a 7 mm hypoechoic area with internal echoes which was avascular at 3:00. There was also a 9 mm left axillary lymph node noted. BIRADS 3, 6 month follow-up was recommended.  -- Bilateral breast ultrasound 01/14/15 ([place]) showed an intraparenchymal lymph node in the area of indicated pain in the right breast at 10:00, 7 cm which appeared stable on mammography since 06/27/2012. No other masses were identified. The left subareolar region there was duct ectasia. A single duct at 4:00 contained some intraductal debris however, at 4:00 subareolar there are 2 additional areas of intraductal mass with internal blood flow most consistent with papillary disease. The patient reports a history of nipple discharge. Denies discharge at the time today's study. Impression: 1. No suspicious findings in the right breast in the area of palpable concern. There is a stable intraparenchymal lymph node. 2. Two left subareolar intraductal masses at 4:00 most consistent with papillary disease. Patient reports previous discharge from left breast. Ultrasound-guided biopsy of one of these masses is recommended. Management of the additional mass would be based on biopsy results. These findings were discussed in detail with the patient at the time of today's procedure by Dr. Silverio Decamp. BIRADS 4.     Reproductive health:  Age at Menarche: 69  Age at First Live Birth: 26   Age at Menopause: premenopausal   Gravida: 2    Para: 1   Breastfeeding: Yes, x 10 days      Procedure: Left lumpectomy, 04/15/15  Pertinent PMH: Hypothyroidism, Migraines, CHF during pregnancy, Hx of TIA, hx of smoking 3/4 PPD x 20+ years  Family History:  Maternal great aunt with breast cancer, maternal great aunt with ovarian cancer, maternal aunt with breast cancer, and two maternal uncles with prostate cancer.   Physical Exam on Presentation: Normal clinical breast exam. No axillary, infraclavicular, or supraclavicular adenopathy.   Referred by: Carylon Perches, APRN/Leslie Berle Mull, APRN     ? Headache(784.0) 01/22/2011   ? Speech abnormality 01/22/2011         Review of Systems   Constitution: Negative.   HENT: Negative.    Eyes: Negative.    Cardiovascular: Negative.    Respiratory: Negative.    Endocrine: Negative.    Hematologic/Lymphatic: Negative.    Skin: Negative.    Musculoskeletal: Negative.    Gastrointestinal: Negative.    Genitourinary: Negative.    Neurological: Negative.    Psychiatric/Behavioral: Negative.    Allergic/Immunologic: Negative.  All other systems reviewed and are negative.      Physical Exam  General Appearance: no distress  Skin: warm and dry  Digits and Nails: normal color, smooth symmetric nails and digits  Eyes: conjunctivae and lids normal  Neck Veins: JVP 5cm, -HJR  Auscultation/Percussion: breathing comfortably, lungs clear to auscultation, no rales or rhonchi, no wheezing  Cardiac Auscultation: Regular rhythm, S1, S2, no S3 or S4, no murmur  Carotid Arteries: normal carotid upstroke bilaterally, no bruit  Radial Arteries: normal symmetric radial pulses  Pedal Pulses: pulses 2+, symmetric  Lower Extremity Edema: trace BLE edema at sock line  Abdominal Exam: soft, non-tender,  bowel sounds normal  Gait & Station: normal balance and gait  Muscle Strength: normal strength and tone  Orientation: clear historian, good insight         Problems Addressed Today  Encounter Diagnoses   Name Primary?   ? Peripartum cardiomyopathy Yes   ? Hypertension, unspecified type    ? Attention deficit disorder (ADD) without hyperactivity        Assessment and Plan  1. Peripartum cardiomyopathy   -Appears euvolemic upon exam on Lasix 20 mg daily and spironolactone 100 mg twice daily (prescribed for cystic acne)  - echo from 04/25/19: LVEF 65% with normal diastolic function, normal RV, and no significant valvular disease  -Follow-up in 6 months with Dr. Pierre Bali   2. Hypertension, unspecified type   -BP 114/47   3. Attention deficit disorder (ADD) without hyperactivity   - patient is requesting about starting stimulants for ADD->will send inbasket to Dr. Pierre Bali re: this  - Viibryd is helping per her report       Plan:  The following is a copy of the written instructions and plan I gave to the patient during her office visit.    Patient Instructions       Thank you for coming to [facility]. Your instructions today:     1.  Recommendations: no changes in medications.    2.  I will touch base with Dr. Pierre Bali about ADD treatment and we will get back to you via MyChart  2.  Next follow up appointment in 6 months with Dr. Pierre Bali    For up to date information on the COVID-19 virus, visit the [facility] website.  ? General supportive care during cold and flu season and infection prevention reminders:   o Wash hands often with soap and water for at least 20 seconds  o Cover your mouth and nose  o Stay home if sick and symptoms mild or manageable  - If you must be around people wear a mask    ? If you are having symptoms of a lower respiratory infection (cough, shortness of breath) and/or fever AND either traveled in last 30 days (internationally or to region of exposure) OR known exposure to patient with COVID19:    o Call your primary care provider for questions or health needs.   - Tell your doctor about your recent travel and your symptoms    o In a medical emergency, call 911 or go to the nearest emergency room.      If you wish to contact us, please call and leave a message for the heart failure nurses at [phone].   To schedule or change an appointment call [phone].    Renita Papa, MD, MSc  Ivory Broad, APRN  Elayne Snare, RN  [facility]  [facility]  Phone: [phone]   Fax: [phone]  Scheduling: [phone]    Your Heart Failure Symptom Awareness and Action Plan  Every Day Action Plan  ? Weigh yourself in the morning before breakfast. Write it down and compare it to yesterday's weight.  ? Take your medicine, as prescribed. Please call if you have concerns about the side effects, cost or refills.  ? Check for worsened swelling in your feet, ankles and stomach  ? Follow a 2000mg  salt diet.  ? Keep all healthcare appointments    Green Zone   Good! Symptoms are under control ? No shortness of breath  ? No increase in ankle swelling  ? No weight gain  ? No chest pain  ? No change in your usual activity  ? Continue to follow your everyday action plan.    Yellow Zone  If you have any of these symptoms, please call the heart failure nurses: [phone] ? Increased shortness of breath with activity  ? Weight gain of 3 pounds in one day or 5 pounds in a week  ? Increased swelling in your ankles or legs  ? Increased swelling in your stomach  ? Increasing fatigue  ? You may need an adjustment of your medications.    Red Zone  These are urgent symptoms. Please call the heart failure nurses: [phone] ? Shortness of breath at rest or waking up at night feeling short of breath or coughing  ? Increased number of pillows used or needing to sit upright to sleep  ? Chest tightness at rest  ? Dizziness, lightheadedness or feeling faint You need to schedule an appointment   Emergency Zone ? call 911 ? Worsening chest tightness or pain that is not relieved by medication  ? Severe shortness of breath and a cough with pink, frothy sputum           No future appointments.        Education/counseling time spent: 20 minutes of 30 minute visit  Topics:  HF disease process, Treatment options, Treatment risks and benefits, Medication instructions, Medication interactions, Daily weight monitoring, Sodium restriction, Personalized exercise guidelines,  Knows HF symptoms, Knows when to call, Knows who to call, Knows next appt.           Current Medications (including today's revisions)  ? clindamycin (CLEOCIN T) 1 % topical lotion Apply  topically to affected area twice daily.   ? furosemide (LASIX) 20 mg tablet TAKE 1 TABLET BY MOUTH DAILY AS NEEDED FOR WEIGHT GAIN,SWELLING OR SHORTNESS OF BREATH   ? ibuprofen (ADVIL) 200 mg tablet Take 400 mg by mouth every 8 hours as needed for Pain. Take with food.   ? levothyroxine (SYNTHROID) 137 mcg tablet Take 137 mcg by mouth daily 30 minutes before breakfast.   ? LORazepam (ATIVAN) 0.5 mg tablet Take 0.5 mg by mouth daily as needed.   ? metroNIDAZOLE (METROCREAM) 0.75 % topical cream apply to affected areas on the face twice daily   ? Multivitamin Cmb No.21-Iron-FA (DAILY MULTIPLE) 18-400 mg-mcg tab Take 1 tablet by mouth daily.   ? spironolactone (ALDACTONE) 100 mg tablet TAKE 1 TABLET BY MOUTH TWICE DAILY WITH FOOD   ? tretinoin (RETIN-A) 0.1 % topical cream apply pea sized amount to face at bedtime, 2-3 nights per week. Advance to nightly as tolerated.   ? VIIBRYD 20 mg tablet Take 20 mg by mouth daily.

## 2019-05-25 ENCOUNTER — Encounter: Admit: 2019-05-25 | Discharge: 2019-05-25 | Payer: BC Managed Care – PPO

## 2019-05-25 NOTE — Telephone Encounter
mychart messge sent to patient. lab orders placed.

## 2019-05-25 NOTE — Telephone Encounter
-----   Message from Rayburn Go, APRN-NP sent at 05/24/2019  6:23 PM CDT -----  See below.  I honestly can't remember how long she told me she had been on the [place], but we may need a follow-up BMP.  Thanks    Marcene Brawn    ----- Message -----  From: Jamison Neighbor, MD  Sent: 05/18/2019   7:22 PM CDT  To: Rayburn Go, APRN-NP    Agree to avoid stimulants, specifically the ritalin/adderall type.  This one seems to not be in that family and is likely OK so long as we monitor blood pressure, and also I would also make sure we check labs in 2-4 weeks after it was started to verify sodium/lytes are ok.     Thanks.  ----- Message -----  From: Rayburn Go, APRN-NP  Sent: 05/18/2019   5:01 PM CDT  To: Jamison Neighbor, MD    Question for you from Ms. Gibbs:    She has been recently diagnosed with ADD.  She was asking about treatment with stimulants.  I told her, in general, we would prefer to to use that category of medications.  However, I told her I would ask you.  She is now of Viibryd and says that is helping, especially with the anxiety portion.      Thanks.    Marcene Brawn

## 2019-05-31 ENCOUNTER — Encounter: Admit: 2019-05-31 | Discharge: 2019-05-31 | Payer: BC Managed Care – PPO

## 2019-06-22 ENCOUNTER — Encounter: Admit: 2019-06-22 | Discharge: 2019-06-22

## 2019-06-26 ENCOUNTER — Encounter: Admit: 2019-06-26 | Discharge: 2019-06-26

## 2019-06-26 ENCOUNTER — Ambulatory Visit: Admit: 2019-06-26 | Discharge: 2019-06-27

## 2019-06-26 DIAGNOSIS — L720 Epidermal cyst: Secondary | ICD-10-CM

## 2019-06-26 DIAGNOSIS — D242 Benign neoplasm of left breast: Secondary | ICD-10-CM

## 2019-06-26 DIAGNOSIS — E039 Hypothyroidism, unspecified: Secondary | ICD-10-CM

## 2019-06-26 DIAGNOSIS — O903 Peripartum cardiomyopathy: Secondary | ICD-10-CM

## 2019-06-26 DIAGNOSIS — G43909 Migraine, unspecified, not intractable, without status migrainosus: Secondary | ICD-10-CM

## 2019-06-26 DIAGNOSIS — I509 Heart failure, unspecified: Secondary | ICD-10-CM

## 2019-06-26 DIAGNOSIS — C801 Malignant (primary) neoplasm, unspecified: Secondary | ICD-10-CM

## 2019-06-26 NOTE — Progress Notes
Date of Service: 06/26/2019    Subjective:             Monica Powell is a 49 y.o. female.    History of Present Illness  LV (04/27/2019)    # Cyst on face  - Reports persistence of cyst since last visit  - has intermittent drainage   Past Hx:  - pt reports has popped it 3 times with some drainage  - reports picking at it  ?  # Patient has a history of brown and tan spots distributed over the head, trunk, arms and legs.??  -These have been present for many years.??  - These get darker with sun exposure.??  - There is?a?history of blistering sunburns.  - Father and Paternal Grandfather have hx of Melanoma   ?  # Acne Excorie  # Folliculitis  Past Hx:  - Current treatments include:?none.   - Past treatments include:?oral abx.??  - Mostly along jaw line, chin, back and chest  - Acne flares with menstrual cycles, cycles are regular   - Cannot take birth control because of previous heart failure during pregnancy  - Does not follow with cardiologist, patient does have low blood pressure ?  - Patient does pick at lesions  - Has sores in scalp   - Patient just had breast biopsy  - Patient does not have any first degree relatives with breast cancer?  Interval:  -?using clindamycin (BID) and tretinoin (2-3 times per week)  - taking oral spironolactone 50 mg BID  - tolerating meds well  - has improvement but still getting a few pimples here and there  ?  ?  ?  No personal hx of skin cancer  Father and Paternal Grandfather have hx of Melanoma   SH: works at [employer]     Review of Systems   Constitutional: Negative for appetite change and unexpected weight change.   Gastrointestinal: Negative for diarrhea, nausea and vomiting.         Objective:         ? clindamycin (CLEOCIN T) 1 % topical lotion Apply  topically to affected area twice daily.   ? furosemide (LASIX) 20 mg tablet TAKE 1 TABLET BY MOUTH DAILY AS NEEDED FOR WEIGHT GAIN,SWELLING OR SHORTNESS OF BREATH   ? ibuprofen (ADVIL) 200 mg tablet Take 400 mg by mouth every 8 hours as needed for Pain. Take with food.   ? levothyroxine (SYNTHROID) 137 mcg tablet Take 137 mcg by mouth daily 30 minutes before breakfast.   ? LORazepam (ATIVAN) 0.5 mg tablet Take 0.5 mg by mouth daily as needed.   ? metroNIDAZOLE (METROCREAM) 0.75 % topical cream apply to affected areas on the face twice daily   ? Multivitamin Cmb No.21-Iron-FA (DAILY MULTIPLE) 18-400 mg-mcg tab Take 1 tablet by mouth daily.   ? spironolactone (ALDACTONE) 100 mg tablet TAKE 1 TABLET BY MOUTH TWICE DAILY WITH FOOD   ? tretinoin (RETIN-A) 0.1 % topical cream apply pea sized amount to face at bedtime, 2-3 nights per week. Advance to nightly as tolerated.   ? VIIBRYD 20 mg tablet Take 20 mg by mouth daily.     Vitals:    06/26/19 1043   Temp: (!) 35.1 ?C (95.2 ?F)   Weight: 86.2 kg (190 lb)   Height: 167.6 cm (66)     Body mass index is 30.67 kg/m?Marland Kitchen     Physical Exam  Areas Examined (all normal unless noted below):  General: Alert and Oriented  x 3, Well-nourished  Eyes: Normal Conjunctivae, EOMI  Psych: normal mood  Head/Face  Neck  ?  Pertinent findings include:  Erythematous inflamed nodule with central punctum on R upper cutaneous lip  Minimal Erythematous patches with telangiectasia are noted?over the cheeks, chin,?anterior neck and superficial chest.  Few erythematous smooth?1-4 mm papules are?noted?on the face  ?  Assessment and Plan:  ?  # Cyst on R upper lip  - Discussed diagnosis, etiology, and treatment option of excision & removal. Elected for excision   - Plan for excision clinic   ?  #Acne Vulgaris  # Folliculitis?  - Etiology, course, prognosis, and tx options d/w pt today  - Advised that acne requires constant, regular care over months to see improvements  -?continue?Rx tretinoin 0.1% cream - apply pea-sized amount TIW-nightly as tolerated -refilled today. If causing too much dryness, apply tretinoin cream over thin layer of CeraVe lotion.?  - (Rx) continue spironolactone 100mg  in AM then 50mg  in PM. If tolerating well, can take 100mg  BID. A/R/B of medication discussed in detail including menstrual irregularities, GI symptoms, hyperkalemia, orthostatic hypotension, headache, dizziness, fatigue, muscle cramps  ??Discussed she should not become pregnant on this medication?  ??Should pt become pregnant inadvertently or plan to become pregnant, spironolactone needs to be stopped right away.  ??Pt does not have a personal or family hx of breast cancer  ??Patient has no history of cardiac or renal disease   ??Discussed that pt needs to hold spironolactone if taking any sulfa drug  ??Discussed theoretical increased risk of breast cancer although as of now there are data that showed no increased risks of breast cancer in pts with family hx of breast cancer  - given history peripartum cardiomyopathy, will notify cardiologist that we are starting this medication. Advised to monitor her BP and for symptoms of dehydration given she is already on lasix. Order BMP at next visit after being on this medication ?  - if no improvement on spironolactone, could consider doxycycline?  - Continue clindamycin 1% lotion 1-2 times per day as needed, refilled today  - Continue BPO 10% wash qday  ?  #Rosacea  -?continue?metronidazole 0.75% gel twice daily   ?  ?  RTC?prn

## 2019-06-26 NOTE — Progress Notes
ATTESTATION    I personally performed the key portions of the E/M visit, discussed case with resident and concur with resident documentation of history, physical exam, assessment, and treatment plan unless otherwise noted.    Staff name:  Mikeal Hawthorne MD Date:  06/26/2019

## 2019-06-27 ENCOUNTER — Ambulatory Visit: Admit: 2019-06-27 | Discharge: 2019-06-28 | Payer: BC Managed Care – PPO

## 2019-06-27 ENCOUNTER — Encounter: Admit: 2019-06-27 | Discharge: 2019-06-27

## 2019-06-27 DIAGNOSIS — L720 Epidermal cyst: Principal | ICD-10-CM

## 2019-06-27 DIAGNOSIS — O903 Peripartum cardiomyopathy: Secondary | ICD-10-CM

## 2019-06-27 DIAGNOSIS — D242 Benign neoplasm of left breast: Secondary | ICD-10-CM

## 2019-06-27 DIAGNOSIS — G43909 Migraine, unspecified, not intractable, without status migrainosus: Secondary | ICD-10-CM

## 2019-06-27 DIAGNOSIS — C801 Malignant (primary) neoplasm, unspecified: Secondary | ICD-10-CM

## 2019-06-27 DIAGNOSIS — E039 Hypothyroidism, unspecified: Secondary | ICD-10-CM

## 2019-06-27 DIAGNOSIS — I509 Heart failure, unspecified: Secondary | ICD-10-CM

## 2019-06-27 NOTE — Procedures
Excision Procedure Note    Risk and benefits of the above procedure including bleeding, pain, dyspigmentation, scar, infection, recurrence, or nerve damage with loss of muscle function and/or skin sensation were discussed with the patient (or legal guardian) in detail, who afterwards decided to proceed with the procedure.    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  Body Site: R upper klip   Preop size: 1.3 cm                   Margin: 0 mm  Final size: 1.3 cm  Preparation:  Chlorhexidine  Anesthesia:  1% lidocaine with epinephrine  Excison:  Lesion fully excised to mid fat or fascia with standing cone repair  Undermining: extensive for optimal functional outcome  Hemostasis:  Pressure, hyfrecation  Closure: complex with extensive undermining and fat closed in fascia    Epidermal: 4-0 chrome    Dermal: 5-0 monocryl  Final wound length: 1.8 cm  Wound dressing: Vaseline w/ pressure bandage  Wound care instructions given: Yes  Suture removal in 7 days  Complications: None  Tolerated well:  Yes  Ambulated from room:  Yes  Pathology sent to:  [facility]  Duration of procedure:  >5 minutes      Procedure Time Out Check List:  Prior to the start of the procedure, I personally confirmed the following:    Site Marking Verified: Yes, as appropriate  Patient Identity (name & date of birth): Yes  Procedure: Yes  Site: Yes  Body Part: see above    The risks of the procedure, including infection, bleeding, pain and skin changes, were discussed with the patient.

## 2019-06-27 NOTE — Patient Instructions
Post Operative Wound Care Instructions for Skin Surgery    Day of Procedure:    Do not disturb the dressing that was applied following the procedure.    Keep the wound and the dressing dry.    If you experience any bleeding, it should be minimal.  You may use a clean gauze to apply direct pressure to the wound for 15 minutes as needed.    Do not take aspirin, products containing aspirin or non-selective anti-inflammatory drugs (NSAIDs), as they may encourage more bleeding. You may take Tylenol (acetaminophen) for pain if needed.    If Steri-Strips were applied, do not peel or disturb these strips.  They are there to add secondary support to the wound.  Please let the strips fall off naturally, this should take anywhere from 1-7 days.  Once the strips have fallen off, you may clean the area lightly with mild soap and water and apply Vaseline (petroleum jelly, not lotion) to the clean dry area.    Day after Procedure:    Wait 24 hours after the procedure to remove any dressing or band-aid.    You may shower.  Do not swim or allow the wound to remain submerged water - this includes bathes, hot tubs, swimming pools and lakes.     Wash your hands first with soap and water   Gently clean the wound once to twice daily with mild soap and water, pat dry with a clean towel.   Apply Vaseline to the wound to prevent scabbing and scarring and always keep covered with a clean band-aid until healed.   Until the stitches are REMOVED, please avoid activities that risk breaking your stitches, including (but not limited to) vigorous exercising, stretching, and lifting anything above 10 lbs.    If sutures were placed on the arms, legs, scalp, or trunk, they are usually removed in 10-14 days.  If sutures were placed on the face, they are usually removed in 5-7 days.    If you have any questions or problems concerning your wound, you may call a Staff member by calling the hospital switchboard at [phone] and asking the page operator to contact the on-call Dermatology resident.      Post Operative Wound Care Instructions for Skin Surgery    Day of Procedure:    Do not disturb the dressing that was applied following the procedure.    Keep the wound and the dressing dry.    If you experience any bleeding, it should be minimal.  You may use a clean gauze to apply direct pressure to the wound for 15 minutes as needed.    Do not take aspirin, products containing aspirin or non-selective anti-inflammatory drugs (NSAIDs), as they may encourage more bleeding. You may take Tylenol (acetaminophen) for pain if needed.    If Steri-Strips were applied, do not peel or disturb these strips.  They are there to add secondary support to the wound.  Please let the strips fall off naturally, this should take anywhere from 1-7 days.  Once the strips have fallen off, you may clean the area lightly with mild soap and water and apply Vaseline (petroleum jelly, not lotion) to the clean dry area.    Day after Procedure:    Wait 24 hours after the procedure to remove any dressing or band-aid.    You may shower.  Do not swim or allow the wound to remain submerged water - this includes bathes, hot tubs, swimming pools and lakes.  Wash your hands first with soap and water   Gently clean the wound once to twice daily with mild soap and water, pat dry with a clean towel.   Apply Vaseline to the wound to prevent scabbing and scarring and always keep covered with a clean band-aid until healed.   Until the stitches are REMOVED, please avoid activities that risk breaking your stitches, including (but not limited to) vigorous exercising, stretching, and lifting anything above 10 lbs.    If sutures were placed on the arms, legs, scalp, or trunk, they are usually removed in 10-14 days.  If sutures were placed on the face, they are usually removed in 5-7 days.    If you have any questions or problems concerning your wound, you may call a Staff member by calling the hospital switchboard at [phone] and asking the page operator to contact the on-call Dermatology resident.

## 2019-07-29 ENCOUNTER — Encounter: Admit: 2019-07-29 | Discharge: 2019-07-29

## 2019-07-29 DIAGNOSIS — L700 Acne vulgaris: Secondary | ICD-10-CM

## 2019-07-31 MED ORDER — SPIRONOLACTONE 100 MG PO TAB
ORAL_TABLET | Freq: Two times a day (BID) | ORAL | 3 refills | 90.00000 days | Status: AC
Start: 2019-07-31 — End: ?

## 2019-10-06 ENCOUNTER — Encounter: Admit: 2019-10-06 | Discharge: 2019-10-06

## 2019-10-13 ENCOUNTER — Encounter: Admit: 2019-10-13 | Discharge: 2019-10-13

## 2020-06-25 ENCOUNTER — Encounter: Admit: 2020-06-25 | Discharge: 2020-06-25

## 2020-06-25 DIAGNOSIS — L700 Acne vulgaris: Secondary | ICD-10-CM

## 2020-06-25 DIAGNOSIS — L739 Follicular disorder, unspecified: Secondary | ICD-10-CM

## 2020-06-25 MED ORDER — TRETINOIN 0.1 % TP CREA
Freq: Every evening | 1 refills | Status: AC
Start: 2020-06-25 — End: ?

## 2020-06-25 MED ORDER — CLINDAMYCIN PHOSPHATE 1 % TP LOTN
Freq: Two times a day (BID) | TOPICAL | 1 refills | 7.00000 days | Status: AC
Start: 2020-06-25 — End: ?
